# Patient Record
Sex: Female | Born: 1955 | Race: White | Hispanic: No | Marital: Married | Smoking: Never smoker
Health system: Southern US, Community
[De-identification: ages and names within clinical notes are randomized; demographics above are authoritative.]

## PROBLEM LIST (undated history)

## (undated) DIAGNOSIS — A770 Spotted fever due to Rickettsia rickettsii: Secondary | ICD-10-CM

## (undated) DIAGNOSIS — K5792 Diverticulitis of intestine, part unspecified, without perforation or abscess without bleeding: Secondary | ICD-10-CM

## (undated) DIAGNOSIS — K2970 Gastritis, unspecified, without bleeding: Secondary | ICD-10-CM

## (undated) HISTORY — PX: APPENDECTOMY: SHX54

---

## 1999-01-23 ENCOUNTER — Other Ambulatory Visit: Admission: RE | Admit: 1999-01-23 | Discharge: 1999-01-23 | Payer: Self-pay | Admitting: Obstetrics and Gynecology

## 2000-04-19 ENCOUNTER — Inpatient Hospital Stay (HOSPITAL_COMMUNITY): Admission: AD | Admit: 2000-04-19 | Discharge: 2000-04-19 | Payer: Self-pay | Admitting: Urology

## 2002-08-06 ENCOUNTER — Encounter: Payer: Self-pay | Admitting: Neurosurgery

## 2004-08-02 ENCOUNTER — Encounter (INDEPENDENT_AMBULATORY_CARE_PROVIDER_SITE_OTHER): Payer: Self-pay | Admitting: Specialist

## 2004-08-02 ENCOUNTER — Ambulatory Visit (HOSPITAL_COMMUNITY): Admission: RE | Admit: 2004-08-02 | Discharge: 2004-08-02 | Payer: Self-pay | Admitting: Plastic Surgery

## 2004-08-02 ENCOUNTER — Ambulatory Visit (HOSPITAL_BASED_OUTPATIENT_CLINIC_OR_DEPARTMENT_OTHER): Admission: RE | Admit: 2004-08-02 | Discharge: 2004-08-02 | Payer: Self-pay | Admitting: Plastic Surgery

## 2006-12-27 ENCOUNTER — Emergency Department (HOSPITAL_COMMUNITY): Admission: EM | Admit: 2006-12-27 | Discharge: 2006-12-27 | Payer: Self-pay | Admitting: Emergency Medicine

## 2006-12-31 ENCOUNTER — Emergency Department (HOSPITAL_COMMUNITY): Admission: EM | Admit: 2006-12-31 | Discharge: 2006-12-31 | Payer: Self-pay | Admitting: Emergency Medicine

## 2007-03-19 ENCOUNTER — Emergency Department (HOSPITAL_COMMUNITY): Admission: EM | Admit: 2007-03-19 | Discharge: 2007-03-20 | Payer: Self-pay | Admitting: Emergency Medicine

## 2007-03-20 ENCOUNTER — Inpatient Hospital Stay (HOSPITAL_COMMUNITY): Admission: AD | Admit: 2007-03-20 | Discharge: 2007-03-20 | Payer: Self-pay | Admitting: Obstetrics and Gynecology

## 2007-03-20 ENCOUNTER — Emergency Department (HOSPITAL_COMMUNITY): Admission: EM | Admit: 2007-03-20 | Discharge: 2007-03-21 | Payer: Self-pay | Admitting: Emergency Medicine

## 2007-03-21 ENCOUNTER — Emergency Department (HOSPITAL_COMMUNITY): Admission: EM | Admit: 2007-03-21 | Discharge: 2007-03-21 | Payer: Self-pay | Admitting: Emergency Medicine

## 2007-03-24 ENCOUNTER — Encounter: Admission: RE | Admit: 2007-03-24 | Discharge: 2007-03-24 | Payer: Self-pay | Admitting: Otolaryngology

## 2007-03-30 ENCOUNTER — Emergency Department (HOSPITAL_COMMUNITY): Admission: EM | Admit: 2007-03-30 | Discharge: 2007-03-31 | Payer: Self-pay | Admitting: Emergency Medicine

## 2007-07-23 ENCOUNTER — Emergency Department (HOSPITAL_COMMUNITY): Admission: EM | Admit: 2007-07-23 | Discharge: 2007-07-23 | Payer: Self-pay | Admitting: Family Medicine

## 2008-01-13 ENCOUNTER — Ambulatory Visit (HOSPITAL_COMMUNITY): Admission: RE | Admit: 2008-01-13 | Discharge: 2008-01-13 | Payer: Self-pay | Admitting: Obstetrics and Gynecology

## 2008-01-13 ENCOUNTER — Encounter (INDEPENDENT_AMBULATORY_CARE_PROVIDER_SITE_OTHER): Payer: Self-pay | Admitting: Obstetrics and Gynecology

## 2008-08-11 ENCOUNTER — Encounter: Admission: RE | Admit: 2008-08-11 | Discharge: 2008-08-11 | Payer: Self-pay | Admitting: Otolaryngology

## 2010-07-23 ENCOUNTER — Encounter: Payer: Self-pay | Admitting: Obstetrics and Gynecology

## 2010-07-23 ENCOUNTER — Encounter: Payer: Self-pay | Admitting: Gastroenterology

## 2010-11-13 NOTE — Op Note (Signed)
NAMEJUBILEE, VIVERO               ACCOUNT NO.:  [id number]   MEDICAL RECORD NO.:  [id number]          PATIENT TYPE:  AMB   LOCATION:  SDC                           FACILITY:  [facility]   PHYSICIAN:  Juluis Mire, M.D.   DATE OF BIRTH:  29-Mar-1956   DATE OF PROCEDURE:  01/13/2008  DATE OF DISCHARGE:                               OPERATIVE REPORT   PREOPERATIVE DIAGNOSES:  Endometrial polyp and uterine fibroids.   POSTOPERATIVE DIAGNOSES:  Endometrial polyp and uterine fibroids.   PROCEDURES:  1. Paracervical block.  2. Cervical dilatation.  3. Hysteroscopic evaluation with resection of polyp.  4. Biopsy of uterine fibroids.  5. Endometrial curetting.   SURGEON:  Juluis Mire, MD.   ANESTHESIA:  General.   ESTIMATED BLOOD LOSS:  Minimal.   PACKS AND DRAINS:  None.   INTRAOPERATIVE BLOOD PLACED:  None.   COMPLICATIONS:  None.   INDICATIONS:  As dictated in the history and physical.   PROCEDURE:  The patient was taken to the OR and placed in supine  position.  After satisfactory level of general anesthesia was obtained,  the patient was placed in dorsal spine position using the Allen  stirrups.  The perineum and vagina were prepped out with Betadine and  bladder was emptied by catheterization.  The patient was then draped in  sterile field.  A speculum was placed in the vaginal vault.  The cervix  was grasped with a single-tooth tenaculum.  Uterus was sounded to  approximately 10 cm.  Cervix was serially dilated to a size 29 Pratt  dilator.  Operative hysteroscope was then introduced.  It was noted that  the uterine cavity did deviate slightly to the patient's left side.  She  did have a polyp-like growth to the endocervical canal.  This was  resected and sent to pathology.  As we proceeded up to the uterine  fundus, she had a large submucosal fibroid posteriorly and apparent  polyp above this.  The polyp was resected totally and sent for pathology  and biopsy was attained  of the uterine fibroid.  The rest of the  endometrium was atrophic and unremarkable.  Total deficit was 100 mL.  We did do endometrial curettings.  At this  point in time, the hysteroscope and single-tooth speculum were then  removed.  The patient was taken out of the dorsal position once alert  and extubated and transferred to the recovery room in good condition.  Sponge, instrument, and needle count was reported correct by circulating  nursing.      Juluis Mire, M.D.  Electronically Signed     JSM/MEDQ  D:  01/13/2008  T:  01/14/2008  Job:  [id number]

## 2010-11-13 NOTE — H&P (Signed)
Jennifer Cline, Jennifer Cline               ACCOUNT NO.:  [id number]   MEDICAL RECORD NO.:  [id number]           PATIENT TYPE:   LOCATION:                                 FACILITY:   PHYSICIAN:  Juluis Mire, M.D.   DATE OF BIRTH:  08/23/1955   DATE OF ADMISSION:  DATE OF DISCHARGE:                              HISTORY & PHYSICAL   The patient is a 55 year old, gravida 2, para 2, perimenopausal patient  presents for hysteroscopy.   In terms of the present admission, the patient had a previous  endometrial sampling in 2008, it revealed simple hyperplasia.  There was  no atypia.  Recommendation was for a followup ultrasound and endometrial  sampling.  She was placed on hormone replacement therapy in terms of the  estradiol and Aygestin.  She was not having any vaginal bleeding.  We  repeated a saline infusion ultrasound.  She had evidence of a  significant endometrial polyp and now presents for hysteroscopic  resection.   In terms of allergies, she has no known drug allergies.   MEDICATIONS:  Estradiol 0.5 mg daily and Aygestin 5 mg day 1-20 of each  month.   PAST MEDICAL HISTORY:  Usual childhood diseases.  No significant  sequelae.   PAST SURGICAL HISTORY:  She had appendectomy.  She had a bilateral tubal  ligation.  She had a Bartholin cyst removed.   OBSTETRICAL HISTORY:  She has had 2 vaginal deliveries.   FAMILY HISTORY:  Noncontributory.   SOCIAL HISTORY:  Reveals no tobacco or alcohol use.   REVIEW OF SYSTEMS:  Noncontributory.   PHYSICAL EXAMINATION:  GENERAL:  The patient is afebrile.  VITAL SIGNS:  Stable.  HEENT:  The patient is normocephalic.  Pupils are equal, round, and  reactive to light and accommodation.  Extraocular movements are intact.  Sclerae and conjunctivae are clear.  Oropharynx clear.  NECK:  Without thyromegaly.  BREASTS:  No discrete masses.  LUNGS:  Clear.  CARDIAC SYSTEMS:  Regular rhythm and rate without murmurs or gallops.  ABDOMINAL:   Benign.  No masses, organomegaly, or tenderness.  PELVIC:  Normal external genitalia.  Vaginal mucosa is clear.  Cervix  unremarkable.  Uterus normal size, shape, and contour.  Adnexa free of  masses or tenderness.  EXTREMITIES:  Trace edema.  NEURO:  Grossly within normal limits.   IMPRESSION:  1. Endometrial polyp.  2. History of simple hyperplasia.  3. Exogenous hormone replacement therapy.   PLAN:  At the present time, the patient is to undergo hysteroscopic  evaluation and resection of polyp.  Risks of surgery have been discussed  including the risk of infection.  The risk of hemorrhage, could require  transfusion with the risk of AIDS or hepatitis.  Risk of injury to  adjacent organs through perforation that could require exploratory  surgery.  Risk of deep venous thrombosis and pulmonary embolus.  The  patient verbalizes to understand the indications and risks.      Juluis Mire, M.D.  Electronically Signed     JSM/MEDQ  D:  01/13/2008  T:  01/13/2008  Job:  [id number]

## 2010-11-16 NOTE — Op Note (Signed)
[facility]  Patient:    Jennifer Cline, Jennifer Cline                      MRN: [id number] Proc. Date: 04/19/00 Adm. Date:  [id number] Attending:  Marina Gravel B                           Operative Report  HISTORY OF PRESENT ILLNESS:     Ms. 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  She presents with a two to three day history of increasing right labial swelling and pain.  She was seen in the office on Friday and was noted to have a small Bartholins cyst.  At that point, it was not deemed drainable.  She was given Augmentin to take b.i.d. and instructed to notify us should her pain worsen.  Over the past 24 hours, the pain has increased steadily and the swelling has increased as well.  She called the answering service today to tell me that her symptoms had progressed.  She was instructed to report to maternity admissions for I&D and catheter placement.  PAST MEDICAL HISTORY:           None.  PAST SURGICAL HISTORY:          Marsupialization of Bartholins cysts in the past.  MEDICATIONS:                    Augmentin.  ALLERGIES:                      None.  PHYSICAL EXAMINATION:  VITAL SIGNS:                    Temperature 98.4, pulse 109, blood pressure 130/85.  GENERAL:                        The patient is alert and oriented.  She appears to be in moderate discomfort.  SKIN:                           Warm and dry with no lesions.  PELVIC:                         Normal external female genitalia.  However, there is marked swelling of the right labia majora consistent with a right Bartholins gland cyst.  It is acutely inflamed and tender.  Speculum exam: The remainder of the vagina and cervix appeared normal.  PROCEDURE:                      The skin over the right labia majora was prepped with Betadine and infiltrated with approximately 5 cc of 1%  lidocaine. Incision was made with a knife in the vaginal mucosa.  The abscess was drained with a hemostat.  Word catheter placed.  The patient tolerated well and noted immediate relief of symptoms.  ASSESSMENT:                     Bartholins gland cyst abscess, status post irrigation and drainage and placement of Word catheter.  PLAN:  Continue Augmentin as before.  Follow up in the office on Monday to assess progress.  The patient is instructed to notify us with worsening pain, fever, or other associated symptoms. DD:  04/19/01 TD:  04/20/00 Job: [id number]

## 2011-03-21 LAB — POCT URINALYSIS DIP (DEVICE)
Glucose, UA: NEGATIVE
Ketones, ur: >=160 — AB
Operator id: 126491
Specific Gravity, Urine: 1.01

## 2011-03-28 LAB — CBC
HCT: 43.7
Hemoglobin: 14.9
MCHC: 34
MCV: 87.3
RBC: 5
WBC: 4.8

## 2011-04-11 LAB — URINALYSIS, ROUTINE W REFLEX MICROSCOPIC
Bilirubin Urine: NEGATIVE
Glucose, UA: NEGATIVE
Glucose, UA: NEGATIVE
Hgb urine dipstick: NEGATIVE
Ketones, ur: 40 — AB
Specific Gravity, Urine: >1.03 — ABNORMAL HIGH
Urobilinogen, UA: 0.2
pH: 5
pH: 6

## 2011-04-11 LAB — HEPATIC FUNCTION PANEL
ALT: <8
AST: 11
Total Protein: 7

## 2011-04-11 LAB — CBC
MCV: 86.6
Platelets: 364
WBC: 8.6

## 2011-04-11 LAB — DIFFERENTIAL
Eosinophils Relative: 0
Lymphocytes Relative: 20
Lymphs Abs: 1.7
Monocytes Absolute: 0.6

## 2011-04-11 LAB — COMPREHENSIVE METABOLIC PANEL
AST: 13
Albumin: 4.8
Calcium: 9.6
Chloride: 108
Creatinine, Ser: 0.72
GFR calc Af Amer: >60
Sodium: 142
Total Bilirubin: 2.8 — ABNORMAL HIGH

## 2011-04-11 LAB — URINE MICROSCOPIC-ADD ON

## 2011-04-11 LAB — SEDIMENTATION RATE: Sed Rate: 1

## 2011-04-11 LAB — URINE CULTURE: Colony Count: >=100000

## 2011-04-16 LAB — URINE MICROSCOPIC-ADD ON

## 2011-04-16 LAB — URINALYSIS, ROUTINE W REFLEX MICROSCOPIC
Bilirubin Urine: NEGATIVE
Nitrite: NEGATIVE
Specific Gravity, Urine: 1.012
Urobilinogen, UA: 0.2

## 2011-04-16 LAB — CBC
MCHC: 34.8
MCV: 83.8
RBC: 5.17 — ABNORMAL HIGH

## 2011-04-16 LAB — DIFFERENTIAL
Basophils Absolute: 0
Lymphocytes Relative: 17
Lymphs Abs: 1.5
Monocytes Absolute: 0.2
Neutro Abs: 7.3

## 2011-04-17 LAB — DIFFERENTIAL
Basophils Absolute: 0
Basophils Relative: 0
Neutro Abs: 9.1 — ABNORMAL HIGH
Neutrophils Relative %: 85 — ABNORMAL HIGH

## 2011-04-17 LAB — COMPREHENSIVE METABOLIC PANEL
ALT: 34
AST: 26
Albumin: 4
Alkaline Phosphatase: 68
BUN: 12
CO2: 27
Calcium: 9.4
Chloride: 104
Creatinine, Ser: 0.77
GFR calc Af Amer: >60
GFR calc non Af Amer: >60
Glucose, Bld: 126 — ABNORMAL HIGH
Potassium: 4
Sodium: 139
Total Bilirubin: 2.6 — ABNORMAL HIGH
Total Protein: 7

## 2011-04-17 LAB — CBC
HCT: 41.5
Hemoglobin: 14.4
MCV: 82.9
WBC: 10.7 — ABNORMAL HIGH

## 2011-04-17 LAB — PROTIME-INR
INR: 1
Prothrombin Time: 12.9

## 2011-04-17 LAB — APTT: aPTT: 27

## 2011-09-04 DIAGNOSIS — J329 Chronic sinusitis, unspecified: Secondary | ICD-10-CM | POA: Insufficient documentation

## 2011-11-01 DIAGNOSIS — J302 Other seasonal allergic rhinitis: Secondary | ICD-10-CM | POA: Insufficient documentation

## 2012-11-12 DIAGNOSIS — J329 Chronic sinusitis, unspecified: Secondary | ICD-10-CM | POA: Insufficient documentation

## 2012-12-09 DIAGNOSIS — K5792 Diverticulitis of intestine, part unspecified, without perforation or abscess without bleeding: Secondary | ICD-10-CM | POA: Insufficient documentation

## 2014-02-14 DIAGNOSIS — F4001 Agoraphobia with panic disorder: Secondary | ICD-10-CM | POA: Insufficient documentation

## 2014-03-12 ENCOUNTER — Observation Stay (HOSPITAL_COMMUNITY)
Admission: AD | Admit: 2014-03-12 | Discharge: 2014-03-14 | Disposition: A | Discharge status: Home / Self Care | Payer: BC Managed Care – PPO | Source: Intra-hospital | Attending: Psychiatry | Admitting: Psychiatry

## 2014-03-12 ENCOUNTER — Emergency Department (HOSPITAL_COMMUNITY)
Admission: EM | Admit: 2014-03-12 | Discharge: 2014-03-12 | Disposition: A | Discharge status: Other Acute Inpatient Hospital | Payer: BC Managed Care – PPO | Attending: Emergency Medicine | Admitting: Emergency Medicine

## 2014-03-12 ENCOUNTER — Encounter (HOSPITAL_COMMUNITY): Payer: Self-pay | Admitting: Emergency Medicine

## 2014-03-12 DIAGNOSIS — F1310 Sedative, hypnotic or anxiolytic abuse, uncomplicated: Secondary | ICD-10-CM | POA: Diagnosis not present

## 2014-03-12 DIAGNOSIS — G4700 Insomnia, unspecified: Secondary | ICD-10-CM | POA: Insufficient documentation

## 2014-03-12 DIAGNOSIS — Z8619 Personal history of other infectious and parasitic diseases: Secondary | ICD-10-CM | POA: Diagnosis not present

## 2014-03-12 DIAGNOSIS — R45851 Suicidal ideations: Secondary | ICD-10-CM | POA: Diagnosis not present

## 2014-03-12 DIAGNOSIS — F419 Anxiety disorder, unspecified: Secondary | ICD-10-CM

## 2014-03-12 DIAGNOSIS — F13230 Sedative, hypnotic or anxiolytic dependence with withdrawal, uncomplicated: Secondary | ICD-10-CM

## 2014-03-12 DIAGNOSIS — Z8719 Personal history of other diseases of the digestive system: Secondary | ICD-10-CM | POA: Diagnosis not present

## 2014-03-12 DIAGNOSIS — F13239 Sedative, hypnotic or anxiolytic dependence with withdrawal, unspecified: Secondary | ICD-10-CM | POA: Diagnosis present

## 2014-03-12 DIAGNOSIS — F1320 Sedative, hypnotic or anxiolytic dependence, uncomplicated: Principal | ICD-10-CM | POA: Insufficient documentation

## 2014-03-12 DIAGNOSIS — F411 Generalized anxiety disorder: Secondary | ICD-10-CM | POA: Insufficient documentation

## 2014-03-12 DIAGNOSIS — F329 Major depressive disorder, single episode, unspecified: Secondary | ICD-10-CM | POA: Diagnosis not present

## 2014-03-12 DIAGNOSIS — F13930 Sedative, hypnotic or anxiolytic use, unspecified with withdrawal, uncomplicated: Secondary | ICD-10-CM

## 2014-03-12 DIAGNOSIS — F13939 Sedative, hypnotic or anxiolytic use, unspecified with withdrawal, unspecified: Secondary | ICD-10-CM | POA: Diagnosis present

## 2014-03-12 DIAGNOSIS — Z008 Encounter for other general examination: Secondary | ICD-10-CM | POA: Diagnosis present

## 2014-03-12 DIAGNOSIS — F3289 Other specified depressive episodes: Secondary | ICD-10-CM

## 2014-03-12 DIAGNOSIS — F13232 Sedative, hypnotic or anxiolytic dependence with withdrawal with perceptual disturbance: Secondary | ICD-10-CM

## 2014-03-12 DIAGNOSIS — F13932 Sedative, hypnotic or anxiolytic use, unspecified with withdrawal with perceptual disturbances: Secondary | ICD-10-CM

## 2014-03-12 DIAGNOSIS — F32A Depression, unspecified: Secondary | ICD-10-CM

## 2014-03-12 HISTORY — DX: Diverticulitis of intestine, part unspecified, without perforation or abscess without bleeding: K57.92

## 2014-03-12 HISTORY — DX: Spotted fever due to Rickettsia rickettsii: A77.0

## 2014-03-12 HISTORY — DX: Gastritis, unspecified, without bleeding: K29.70

## 2014-03-12 LAB — COMPREHENSIVE METABOLIC PANEL
ALK PHOS: 62 U/L (ref 39–117)
ALT: 25 U/L (ref 0–35)
ANION GAP: 14 (ref 5–15)
AST: 21 U/L (ref 0–37)
Albumin: 4.5 g/dL (ref 3.5–5.2)
BUN: 4 mg/dL — AB (ref 6–23)
CO2: 24 meq/L (ref 19–32)
Calcium: 9.7 mg/dL (ref 8.4–10.5)
Chloride: 103 mEq/L (ref 96–112)
Creatinine, Ser: 0.62 mg/dL (ref 0.50–1.10)
GLUCOSE: 101 mg/dL — AB (ref 70–99)
POTASSIUM: 4 meq/L (ref 3.7–5.3)
SODIUM: 141 meq/L (ref 137–147)
TOTAL PROTEIN: 7 g/dL (ref 6.0–8.3)
Total Bilirubin: 3.3 mg/dL — ABNORMAL HIGH (ref 0.3–1.2)

## 2014-03-12 LAB — CBC WITH DIFFERENTIAL/PLATELET
Basophils Absolute: 0 10*3/uL (ref 0.0–0.1)
Basophils Relative: 1 % (ref 0–1)
EOS ABS: 0 10*3/uL (ref 0.0–0.7)
EOS PCT: 1 % (ref 0–5)
HCT: 42.2 % (ref 36.0–46.0)
HEMOGLOBIN: 14.5 g/dL (ref 12.0–15.0)
LYMPHS ABS: 1.3 10*3/uL (ref 0.7–4.0)
LYMPHS PCT: 30 % (ref 12–46)
MCH: 28.8 pg (ref 26.0–34.0)
MCHC: 34.4 g/dL (ref 30.0–36.0)
MCV: 83.7 fL (ref 78.0–100.0)
MONOS PCT: 9 % (ref 3–12)
Monocytes Absolute: 0.4 10*3/uL (ref 0.1–1.0)
NEUTROS PCT: 59 % (ref 43–77)
Neutro Abs: 2.5 10*3/uL (ref 1.7–7.7)
PLATELETS: 232 10*3/uL (ref 150–400)
RBC: 5.04 MIL/uL (ref 3.87–5.11)
RDW: 13.4 % (ref 11.5–15.5)
WBC: 4.3 10*3/uL (ref 4.0–10.5)

## 2014-03-12 LAB — RAPID URINE DRUG SCREEN, HOSP PERFORMED
Amphetamines: NOT DETECTED
BARBITURATES: NOT DETECTED
Benzodiazepines: POSITIVE — AB
Cocaine: NOT DETECTED
Opiates: NOT DETECTED
Tetrahydrocannabinol: NOT DETECTED

## 2014-03-12 LAB — ETHANOL

## 2014-03-12 MED ORDER — ONDANSETRON 4 MG PO TBDP
4.0000 mg | ORAL_TABLET | Freq: Four times a day (QID) | ORAL | Status: DC | PRN
  Administered 2014-03-12: 4 mg via ORAL
  Filled 2014-03-12: qty 1

## 2014-03-12 MED ORDER — LOPERAMIDE HCL 2 MG PO CAPS: 2.0000 mg | ORAL_CAPSULE | ORAL | Status: DC | PRN

## 2014-03-12 MED ORDER — VITAMIN B-1 100 MG PO TABS: 100.0000 mg | ORAL_TABLET | Freq: Every day | ORAL | Status: DC

## 2014-03-12 MED ORDER — PSEUDOEPHEDRINE HCL 30 MG PO TABS
30.0000 mg | ORAL_TABLET | Freq: Three times a day (TID) | ORAL | Status: DC | PRN
  Administered 2014-03-12: 30 mg via ORAL
  Filled 2014-03-12: qty 1

## 2014-03-12 MED ORDER — CHLORDIAZEPOXIDE HCL 25 MG PO CAPS: 25.0000 mg | ORAL_CAPSULE | Freq: Three times a day (TID) | ORAL | Status: DC

## 2014-03-12 MED ORDER — CHLORDIAZEPOXIDE HCL 25 MG PO CAPS: 25.0000 mg | ORAL_CAPSULE | ORAL | Status: DC

## 2014-03-12 MED ORDER — ALUM & MAG HYDROXIDE-SIMETH 200-200-20 MG/5ML PO SUSP: 30.0000 mL | ORAL | Status: DC | PRN

## 2014-03-12 MED ORDER — ACETAMINOPHEN 325 MG PO TABS: 650.0000 mg | ORAL_TABLET | ORAL | Status: DC | PRN

## 2014-03-12 MED ORDER — ADULT MULTIVITAMIN W/MINERALS CH
1.0000 | ORAL_TABLET | Freq: Every day | ORAL | Status: DC
  Administered 2014-03-12: 1 via ORAL
  Filled 2014-03-12: qty 1

## 2014-03-12 MED ORDER — CHLORDIAZEPOXIDE HCL 25 MG PO CAPS
25.0000 mg | ORAL_CAPSULE | Freq: Four times a day (QID) | ORAL | Status: DC
  Administered 2014-03-12 (×3): 25 mg via ORAL
  Filled 2014-03-12 (×3): qty 1

## 2014-03-12 MED ORDER — ONDANSETRON HCL 4 MG PO TABS: 4.0000 mg | ORAL_TABLET | Freq: Three times a day (TID) | ORAL | Status: DC | PRN

## 2014-03-12 MED ORDER — IBUPROFEN 200 MG PO TABS: 600.0000 mg | ORAL_TABLET | Freq: Three times a day (TID) | ORAL | Status: DC | PRN

## 2014-03-12 MED ORDER — MIRTAZAPINE 7.5 MG PO TABS
7.5000 mg | ORAL_TABLET | Freq: Every day | ORAL | Status: DC
  Administered 2014-03-12: 7.5 mg via ORAL
  Filled 2014-03-12: qty 1

## 2014-03-12 MED ORDER — CHLORDIAZEPOXIDE HCL 25 MG PO CAPS: 25.0000 mg | ORAL_CAPSULE | Freq: Four times a day (QID) | ORAL | Status: DC | PRN

## 2014-03-12 MED ORDER — CHLORDIAZEPOXIDE HCL 25 MG PO CAPS: 25.0000 mg | ORAL_CAPSULE | Freq: Every day | ORAL | Status: DC

## 2014-03-12 MED ORDER — HYDROXYZINE HCL 25 MG PO TABS: 25.0000 mg | ORAL_TABLET | Freq: Four times a day (QID) | ORAL | Status: DC | PRN

## 2014-03-12 MED ORDER — THIAMINE HCL 100 MG/ML IJ SOLN
100.0000 mg | Freq: Once | INTRAMUSCULAR | Status: AC
  Administered 2014-03-12: 100 mg via INTRAMUSCULAR
  Filled 2014-03-12: qty 2

## 2014-03-12 NOTE — ED Notes (Signed)
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  She subsequently developed an intestinal infection, the treatment of which was a bit protracted.  She had some anxiety associated "with all those problems" and was prescribed Xanax.  She is here today with request to have help to cease Xanax--"I'm just miserable and I don't want to go on like this."  Her husband is with her.

## 2014-03-12 NOTE — Progress Notes (Signed)
Pt's referral has been faxed to the following intpx facilities with bed availability: [facility]- per Misty Stanley a few adult beds available [facility]- faxed for review [facility]- per [place] accepting referrals [facility]- per Carney [facility] beds available [facility]- per [facility] can fax [facility]- per [place] having d/c's and can fax for review  The following facilities are at capacity or do not have SA programming: [facility]- SA exclusionary [facility]- per [place] at capacity [facility]- per Baird Lyons at [employer]- per [employer] at capacity [facility]- no detox [place]- per Rosey Bath at [facility]- per [place] at capacity [facility]- per [place] at [facility]- per [place] at capacity and "have an extensive wait list" [facility]- per [place] [facility]- per [place] at capacity [facility]- per Dannielle Huh at [employer] [facility]- no longer accept referrals per [facility]- [facility]- per Dahlia Client at capacity [facility]- per [place] at capacity   [employer] Disposition MHT

## 2014-03-12 NOTE — Progress Notes (Signed)
Patient admitted to the unit from [facility] with a C/O anxiety. Patient stated "I have a [place] spotted fever few months ago and was prescribed doxycycline and shortly I started having anxiety issues. I was prescribed xanax, the anxiety became worst and I became depressed." patient also stated that she wants to be detox off benzo. Patient denied SI, AH/VH. Patient reports being sleepy from the medication she took at the ED; requested to lay down. Refused nourishment and drink. Support and encouragement offered to the patient. Every 15 minutes check for safety initiated. Will continue to monitor patient.

## 2014-03-12 NOTE — Progress Notes (Signed)
Pt has been declined by [facility] per Tacey Ruiz d/t primary problem being SA.   Tomi Bamberger Disposition MHT

## 2014-03-12 NOTE — ED Notes (Signed)
Pt resting at present, no distress, will monitor for safety.

## 2014-03-12 NOTE — ED Provider Notes (Signed)
CSN: [id number]     Arrival date & time 03/12/14  0813 History   First MD Initiated Contact with Patient 03/12/14 [phone]     Chief Complaint  Patient presents with  . Withdrawal     (Consider location/radiation/quality/duration/timing/severity/associated sxs/prior Treatment) Patient is a 58 y.o. female presenting with mental health disorder. The history is provided by the patient. No language interpreter was used.  Mental Health Problem Presenting symptoms: depression and suicidal thoughts   Presenting symptoms: no agitation   Presenting symptoms comment:  Anxiety  Degree of incapacity (severity):  Severe Onset quality:  Gradual Duration:  2 months Timing:  Constant Progression:  Worsening Chronicity:  New Context: stressful life event   Relieved by:  Nothing Worsened by:  Nothing tried Ineffective treatments:  Anti-anxiety medications Associated symptoms: anhedonia, anxiety, appetite change, insomnia and weight change   Associated symptoms: no abdominal pain, no chest pain, no fatigue and no headaches   Risk factors: no family hx of mental illness, no family violence, no hx of mental illness, no hx of suicide attempts, no neurological disease and no recent psychiatric admission     Past Medical History  Diagnosis Date  . [facility] spotted fever   . Gastritis   . Diverticulitis    Past Surgical History  Procedure Laterality Date  . Appendectomy     History reviewed. No pertinent family history. History  Substance Use Topics  . Smoking status: Never Smoker   . Smokeless tobacco: Not on file  . Alcohol Use: No   OB History   Grav Para Term Preterm Abortions TAB SAB Ect Mult Living                 Review of Systems  Constitutional: Positive for appetite change. Negative for fever, chills, diaphoresis, activity change and fatigue.  HENT: Negative for congestion, facial swelling, rhinorrhea and sore throat.   Eyes: Negative for photophobia and discharge.   Respiratory: Negative for cough, chest tightness and shortness of breath.   Cardiovascular: Negative for chest pain, palpitations and leg swelling.  Gastrointestinal: Negative for nausea, vomiting, abdominal pain and diarrhea.  Endocrine: Negative for polydipsia and polyuria.  Genitourinary: Negative for dysuria, frequency, difficulty urinating and pelvic pain.  Musculoskeletal: Negative for arthralgias, back pain, neck pain and neck stiffness.  Skin: Negative for color change and wound.  Allergic/Immunologic: Negative for immunocompromised state.  Neurological: Negative for facial asymmetry, weakness, numbness and headaches.  Hematological: Does not bruise/bleed easily.  Psychiatric/Behavioral: Positive for suicidal ideas. Negative for confusion and agitation. The patient is nervous/anxious and has insomnia.       Allergies  Phenergan and Doxycycline  Home Medications   Prior to Admission medications   Medication Sig Start Date End Date Taking? Authorizing Provider  ALPRAZolam Prudy Feeler) 0.5 MG tablet Take 0.5 mg by mouth 3 (three) times daily.  02/28/14  Yes Historical Provider, MD  mirtazapine (REMERON) 15 MG tablet Take 15 mg by mouth at bedtime.  03/10/14   Historical Provider, MD   BP 127/65  Pulse 79  Temp(Src) 98.1 F (36.7 C) (Oral)  Resp 17  SpO2 96% Physical Exam  Constitutional: She is oriented to person, place, and time. She appears well-developed and well-nourished. No distress.  HENT:  Head: Normocephalic and atraumatic.  Mouth/Throat: No oropharyngeal exudate.  Eyes: Pupils are equal, round, and reactive to light.  Neck: Normal range of motion. Neck supple.  Cardiovascular: Normal rate, regular rhythm and normal heart sounds.  Exam reveals no gallop and  no friction rub.   No murmur heard. Pulmonary/Chest: Effort normal and breath sounds normal. No respiratory distress. She has no wheezes. She has no rales.  Abdominal: Soft. Bowel sounds are normal. She exhibits  no distension and no mass. There is no tenderness. There is no rebound and no guarding.  Musculoskeletal: Normal range of motion. She exhibits no edema and no tenderness.  Neurological: She is alert and oriented to person, place, and time.  Skin: Skin is warm and dry.  Psychiatric: Her mood appears anxious. She exhibits a depressed mood. She expresses suicidal ideation.  tearful    ED Course  Procedures (including critical care time) Labs Review Labs Reviewed  COMPREHENSIVE METABOLIC PANEL - Abnormal; Notable for the following:    Glucose, Bld 101 (*)    BUN 4 (*)    Total Bilirubin 3.3 (*)    All other components within normal limits  URINE RAPID DRUG SCREEN (HOSP PERFORMED) - Abnormal; Notable for the following:    Benzodiazepines POSITIVE (*)    All other components within normal limits  CBC WITH DIFFERENTIAL  ETHANOL    Imaging Review No results found.   EKG Interpretation None      MDM   Final diagnoses:  Anxiety  Depression  Suicidal ideation  Benzodiazepine dependence    Pt is a 58 y.o. female with Pmhx as above who presents with request for detox from Xanax which she has been taking for the past 2-3 months for increased anxiety. She states for about 2 months she has been having worsening depression and suicidal ideation without clear plan. She states that the episode began with several illnesses including [facility] spotted fever followed by diverticulitis, followed by gastritis. She was placed on amoxicillin for a sinusitis and her last dose of antibiotic was to be today. She denies any other drug use, or alcohol use. She's had no recent fever, chills, coughing, nausea vomiting, diarrhea. She denies HI, or AVH.  On exam she appears anxious, but in no acute distress.  I feel she would benefit from psychiatry consult for evaluation for her depression, suicidal ideation, and request for detox from Xanax.    Pt will be admitted as inpt for BZD detox.      Toy Cookey, MD 03/12/14 [phone]

## 2014-03-12 NOTE — Progress Notes (Signed)
[facility] MD Progress Note  03/12/2014 8:44 PM Jennifer Cline  MRN:  [id number] Subjective:  Request from Nurse to see pt .Requesting to leave after being transferred to obs unit earlier prior to my comming on.Pt is voluntary Diagnosis: Benxzodiazepene/xanax dependence  DSM5: Schizophrenia Disorders:  na Obsessive-Compulsive Disorders:  na Trauma-Stressor Disorders:  na Substance/Addictive Disorders:  Benzodiazepene dependence Depressive Disorders:  NA Total Time spent with patient: 15 minutes  Axis I: Benzodiazepene dependence (iatrogenic) Axis II: Deferred Axis III:  Past Medical History  Diagnosis Date  . [facility] spotted fever   . Gastritis   . Diverticulitis    Axis IV: other psychosocial or environmental problems Axis V: 41-50 serious symptoms  ADL's:  Intact  Sleep: Poor  Appetite:  Fair  Suicidal Ideation:  Intent:  none Homicidal Ideation:  Intent:  none AEB (as evidenced by):  Psychiatric Specialty Exam: Physical Exam  Nursing note and vitals reviewed. Constitutional: She appears well-developed and well-nourished. She appears distressed (anxious-c/o of withdrawal symptoms.Told nurse she wanted to go home tather than transfer to OBS).  HENT:  Head: Normocephalic and atraumatic.  Right Ear: External ear normal.  Left Ear: External ear normal.  C/o of sinus/allergy requests sudafed  Eyes: Conjunctivae and EOM are normal. Pupils are equal, round, and reactive to light. Right eye exhibits no discharge. Left eye exhibits no discharge. No scleral icterus.  Neck: Normal range of motion. Neck supple. No JVD present. No tracheal deviation present.  Cardiovascular: Normal rate and regular rhythm.   Respiratory: Effort normal and breath sounds normal. No stridor.  GI:  Deferred  Genitourinary:  cancel  Musculoskeletal: Normal range of motion.  Neurological: She is alert. No cranial nerve deficit. She exhibits normal muscle tone. Coordination normal.  Skin: Skin  is warm and dry.  Psychiatric:  See PSE    ROS  Blood pressure 113/58, pulse 88, temperature 97.9 F (36.6 C), temperature source Oral, resp. rate 16, SpO2 99.00%.There is no height or weight on file to calculate BMI.  General Appearance: Fairly Groomed  Engineer, water::  Good  Speech:  Clear and Coherent  Volume:  Normal  Mood:  Dysphoric  Affect:  Congruent  Thought Process:  Goal Directed and Intact  Orientation:  Full (Time, Place, and Person)  Thought Content:  WDL  Suicidal Thoughts:  No  Homicidal Thoughts:  No  Memory:  Negative  Judgement:  Fair  Insight:  Fair  Psychomotor Activity:  Normal  Concentration:  Fair  Recall:  [employer] of Knowledge:Fair  Language: Fair  Akathisia:  NA  Handed:  Right  AIMS (if indicated):     Assets:  Desire for Improvement Financial Resources/Insurance Intimacy Social Support  Sleep:  Unable to get good night's sleep with xanax withdrawal   Musculoskeletal: Strength & Muscle Tone: within normal limits Gait & Station: normal Patient leans: N/A  Current Medications: Current Facility-Administered Medications  Medication Dose Route Frequency Provider Last Rate Last Dose  . acetaminophen (TYLENOL) tablet 650 mg  650 mg Oral Q4H PRN Ernestina Patches, MD      . alum & mag hydroxide-simeth (MAALOX/MYLANTA) 200-200-20 MG/5ML suspension 30 mL  30 mL Oral PRN Ernestina Patches, MD      . chlordiazePOXIDE (LIBRIUM) capsule 25 mg  25 mg Oral Q6H PRN Waylan Boga, NP      . chlordiazePOXIDE (LIBRIUM) capsule 25 mg  25 mg Oral QID Waylan Boga, NP   25 mg at 03/12/14 1817   Followed by  . [  START ON 03/13/2014] chlordiazePOXIDE (LIBRIUM) capsule 25 mg  25 mg Oral TID Waylan Boga, NP       Followed by  . [START ON 03/14/2014] chlordiazePOXIDE (LIBRIUM) capsule 25 mg  25 mg Oral BH-qamhs Waylan Boga, NP       Followed by  . [START ON 03/15/2014] chlordiazePOXIDE (LIBRIUM) capsule 25 mg  25 mg Oral Daily Waylan Boga, NP      . hydrOXYzine  (ATARAX/VISTARIL) tablet 25 mg  25 mg Oral Q6H PRN Waylan Boga, NP      . ibuprofen (ADVIL,MOTRIN) tablet 600 mg  600 mg Oral Q8H PRN Ernestina Patches, MD      . loperamide (IMODIUM) capsule 2-4 mg  2-4 mg Oral PRN Waylan Boga, NP      . mirtazapine (REMERON) tablet 7.5 mg  7.5 mg Oral QHS Waylan Boga, NP      . multivitamin with minerals tablet 1 tablet  1 tablet Oral Daily Waylan Boga, NP   1 tablet at 03/12/14 1450  . ondansetron (ZOFRAN) tablet 4 mg  4 mg Oral Q8H PRN Ernestina Patches, MD      . ondansetron (ZOFRAN-ODT) disintegrating tablet 4 mg  4 mg Oral Q6H PRN Waylan Boga, NP   4 mg at 03/12/14 1440  . pseudoephedrine (SUDAFED) tablet 30 mg  30 mg Oral Q8H PRN Dara Hoyer, PA-C      . [START ON 03/13/2014] thiamine (VITAMIN B-1) tablet 100 mg  100 mg Oral Daily Waylan Boga, NP       Current Outpatient Prescriptions  Medication Sig Dispense Refill  . ALPRAZolam (XANAX) 0.5 MG tablet Take 0.5 mg by mouth 3 (three) times daily.       . mirtazapine (REMERON) 15 MG tablet Take 15 mg by mouth at bedtime.         Lab Results:  Results for orders placed during the hospital encounter of 03/12/14 (from the past 48 hour(s))  CBC WITH DIFFERENTIAL     Status: None   Collection Time    03/12/14  8:57 AM      Result Value Ref Range   WBC 4.3  4.0 - 10.5 K/uL   RBC 5.04  3.87 - 5.11 MIL/uL   Hemoglobin 14.5  12.0 - 15.0 g/dL   HCT 42.2  36.0 - 46.0 %   MCV 83.7  78.0 - 100.0 fL   MCH 28.8  26.0 - 34.0 pg   MCHC 34.4  30.0 - 36.0 g/dL   RDW 13.4  11.5 - 15.5 %   Platelets 232  150 - 400 K/uL   Neutrophils Relative % 59  43 - 77 %   Neutro Abs 2.5  1.7 - 7.7 K/uL   Lymphocytes Relative 30  12 - 46 %   Lymphs Abs 1.3  0.7 - 4.0 K/uL   Monocytes Relative 9  3 - 12 %   Monocytes Absolute 0.4  0.1 - 1.0 K/uL   Eosinophils Relative 1  0 - 5 %   Eosinophils Absolute 0.0  0.0 - 0.7 K/uL   Basophils Relative 1  0 - 1 %   Basophils Absolute 0.0  0.0 - 0.1 K/uL  COMPREHENSIVE METABOLIC  PANEL     Status: Abnormal   Collection Time    03/12/14  8:57 AM      Result Value Ref Range   Sodium 141  137 - 147 mEq/L   Potassium 4.0  3.7 - 5.3 mEq/L   Chloride 103  96 -  112 mEq/L   CO2 24  19 - 32 mEq/L   Glucose, Bld 101 (*) 70 - 99 mg/dL   BUN 4 (*) 6 - 23 mg/dL   Creatinine, Ser 0.62  0.50 - 1.10 mg/dL   Calcium 9.7  8.4 - 10.5 mg/dL   Total Protein 7.0  6.0 - 8.3 g/dL   Albumin 4.5  3.5 - 5.2 g/dL   AST 21  0 - 37 U/L   ALT 25  0 - 35 U/L   Alkaline Phosphatase 62  39 - 117 U/L   Total Bilirubin 3.3 (*) 0.3 - 1.2 mg/dL   GFR calc non Af Amer >90  >90 mL/min   GFR calc Af Amer >90  >90 mL/min   Comment: (NOTE)     The eGFR has been calculated using the CKD EPI equation.     This calculation has not been validated in all clinical situations.     eGFR's persistently <90 mL/min signify possible Chronic Kidney     Disease.   Anion gap 14  5 - 15  ETHANOL     Status: None   Collection Time    03/12/14  8:57 AM      Result Value Ref Range   Alcohol, Ethyl (B) <11  0 - 11 mg/dL   Comment:            LOWEST DETECTABLE LIMIT FOR     SERUM ALCOHOL IS 11 mg/dL     FOR MEDICAL PURPOSES ONLY  URINE RAPID DRUG SCREEN (HOSP PERFORMED)     Status: Abnormal   Collection Time    03/12/14  9:00 AM      Result Value Ref Range   Opiates NONE DETECTED  NONE DETECTED   Cocaine NONE DETECTED  NONE DETECTED   Benzodiazepines POSITIVE (*) NONE DETECTED   Amphetamines NONE DETECTED  NONE DETECTED   Tetrahydrocannabinol NONE DETECTED  NONE DETECTED   Barbiturates NONE DETECTED  NONE DETECTED   Comment:            DRUG SCREEN FOR MEDICAL PURPOSES     ONLY.  IF CONFIRMATION IS NEEDED     FOR ANY PURPOSE, NOTIFY LAB     WITHIN 5 DAYS.                LOWEST DETECTABLE LIMITS     FOR URINE DRUG SCREEN     Drug Class       Cutoff (ng/mL)     Amphetamine      1000     Barbiturate      200     Benzodiazepine   330     Tricyclics       076     Opiates          300     Cocaine           300     THC              50    Physical Findings: AIMS:  , ,  ,  ,    CIWA:    COWS:     Treatment Plan Summary: Daily contact with patient to assess and evaluate symptoms and progress in treatment Medication management  Plan:Pt agrees to stay and transfer to observation unit and continue detox protocol .She also requests sudafed for sinus/allergies  Medical Decision Making Problem Points:  Established problem, worsening (2) Data Points:  Review or order clinical lab tests (1)  Review and summation of old records (2)  I certify that inpatient services furnished can reasonably be expected to improve the patient's condition.   Dara Hoyer 03/12/2014, 8:44 PM

## 2014-03-12 NOTE — ED Notes (Signed)
Pt now stating she wants to  [employer], go home.  Dr Linwood Dibbles notified.  Pending psych extender at 8pm to speak with pt.

## 2014-03-12 NOTE — ED Notes (Addendum)
Patient belongings: Shirt, pants, slides, sunglasses and perscription> [facility]

## 2014-03-12 NOTE — Consult Note (Signed)
[facility] Psychiatry Consult   Reason for Consult:  Benzodiazepine dependency Referring Physician:  EDP  Jennifer Cline is an 58 y.o. female. Total Time spent with patient: 20 minutes  Assessment: AXIS I:  Anxiety Disorder NOS, Depressive Disorder NOS and benzodiazepine dependency AXIS II:  Deferred AXIS III:   Past Medical History  Diagnosis Date  . [facility] spotted fever   . Gastritis   . Diverticulitis    AXIS IV:  occupational problems, other psychosocial or environmental problems, problems related to social environment and problems with primary support group AXIS V:  21-30 behavior considerably influenced by delusions or hallucinations OR serious impairment in judgment, communication OR inability to function in almost all areas  Plan:  Recommend psychiatric Inpatient admission when medically cleared. Dr. Louretta Shorten assessed the patient and concurs with the plan.  Subjective:   Jennifer Cline is a 58 y.o. female patient admitted with benzodiazepine detox.  HPI:  The patient was Rx doxycycline for [facility] Spotted Fever a few months ago and started having anxiety issues.  She was prescribed Xanax and her anxiety has progressively gotten worse to the point that she is depressed, not eating or sleeping, not functioning and can't work.  Denies suicidal/homicidal ideations, hallucinations, or alcohol issues.  Her husband is supportive and at her bedside.  Jennifer Cline wants to detox off benzodiazepines and take a medication that is not addictive for her anxiety and depression. HPI Elements:   Location:  generalized. Quality:  acute. Severity:  severe. Timing:  constant. Duration:  few months. Context:  stressors.  Past Psychiatric History: Past Medical History  Diagnosis Date  . [facility] spotted fever   . Gastritis   . Diverticulitis     reports that she has never smoked. She does not have any smokeless tobacco history on file. She reports that she does  not drink alcohol or use illicit drugs. History reviewed. No pertinent family history.         Allergies:   Allergies  Allergen Reactions  . Phenergan [Promethazine Hcl] Nausea And Vomiting  . Doxycycline Anxiety    ACT Assessment Complete:  Yes:    Educational Status    Risk to Self: Risk to self with the past 6 months Is patient at risk for suicide?: Yes Substance abuse history and/or treatment for substance abuse?: No  Risk to Others:    Abuse:    Prior Inpatient Therapy:    Prior Outpatient Therapy:    Additional Information:                    Objective: Blood pressure 127/65, pulse 79, temperature 98.1 F (36.7 C), temperature source Oral, resp. rate 17, SpO2 96.00%.There is no height or weight on file to calculate BMI. Results for orders placed during the hospital encounter of 03/12/14 (from the past 72 hour(s))  CBC WITH DIFFERENTIAL     Status: None   Collection Time    03/12/14  8:57 AM      Result Value Ref Range   WBC 4.3  4.0 - 10.5 K/uL   RBC 5.04  3.87 - 5.11 MIL/uL   Hemoglobin 14.5  12.0 - 15.0 g/dL   HCT 42.2  36.0 - 46.0 %   MCV 83.7  78.0 - 100.0 fL   MCH 28.8  26.0 - 34.0 pg   MCHC 34.4  30.0 - 36.0 g/dL   RDW 13.4  11.5 - 15.5 %   Platelets 232  150 -  400 K/uL   Neutrophils Relative % 59  43 - 77 %   Neutro Abs 2.5  1.7 - 7.7 K/uL   Lymphocytes Relative 30  12 - 46 %   Lymphs Abs 1.3  0.7 - 4.0 K/uL   Monocytes Relative 9  3 - 12 %   Monocytes Absolute 0.4  0.1 - 1.0 K/uL   Eosinophils Relative 1  0 - 5 %   Eosinophils Absolute 0.0  0.0 - 0.7 K/uL   Basophils Relative 1  0 - 1 %   Basophils Absolute 0.0  0.0 - 0.1 K/uL  COMPREHENSIVE METABOLIC PANEL     Status: Abnormal   Collection Time    03/12/14  8:57 AM      Result Value Ref Range   Sodium 141  137 - 147 mEq/L   Potassium 4.0  3.7 - 5.3 mEq/L   Chloride 103  96 - 112 mEq/L   CO2 24  19 - 32 mEq/L   Glucose, Bld 101 (*) 70 - 99 mg/dL   BUN 4 (*) 6 - 23 mg/dL    Creatinine, Ser 0.62  0.50 - 1.10 mg/dL   Calcium 9.7  8.4 - 10.5 mg/dL   Total Protein 7.0  6.0 - 8.3 g/dL   Albumin 4.5  3.5 - 5.2 g/dL   AST 21  0 - 37 U/L   ALT 25  0 - 35 U/L   Alkaline Phosphatase 62  39 - 117 U/L   Total Bilirubin 3.3 (*) 0.3 - 1.2 mg/dL   GFR calc non Af Amer >90  >90 mL/min   GFR calc Af Amer >90  >90 mL/min   Comment: (NOTE)     The eGFR has been calculated using the CKD EPI equation.     This calculation has not been validated in all clinical situations.     eGFR's persistently <90 mL/min signify possible Chronic Kidney     Disease.   Anion gap 14  5 - 15  ETHANOL     Status: None   Collection Time    03/12/14  8:57 AM      Result Value Ref Range   Alcohol, Ethyl (B) <11  0 - 11 mg/dL   Comment:            LOWEST DETECTABLE LIMIT FOR     SERUM ALCOHOL IS 11 mg/dL     FOR MEDICAL PURPOSES ONLY  URINE RAPID DRUG SCREEN (HOSP PERFORMED)     Status: Abnormal   Collection Time    03/12/14  9:00 AM      Result Value Ref Range   Opiates NONE DETECTED  NONE DETECTED   Cocaine NONE DETECTED  NONE DETECTED   Benzodiazepines POSITIVE (*) NONE DETECTED   Amphetamines NONE DETECTED  NONE DETECTED   Tetrahydrocannabinol NONE DETECTED  NONE DETECTED   Barbiturates NONE DETECTED  NONE DETECTED   Comment:            DRUG SCREEN FOR MEDICAL PURPOSES     ONLY.  IF CONFIRMATION IS NEEDED     FOR ANY PURPOSE, NOTIFY LAB     WITHIN 5 DAYS.                LOWEST DETECTABLE LIMITS     FOR URINE DRUG SCREEN     Drug Class       Cutoff (ng/mL)     Amphetamine      1000     Barbiturate  200     Benzodiazepine   500     Tricyclics       938     Opiates          300     Cocaine          300     THC              50   Labs are reviewed and are pertinent for no medical issues noted.  Current Facility-Administered Medications  Medication Dose Route Frequency Provider Last Rate Last Dose  . acetaminophen (TYLENOL) tablet 650 mg  650 mg Oral Q4H PRN Ernestina Patches, MD      . alum & mag hydroxide-simeth (MAALOX/MYLANTA) 200-200-20 MG/5ML suspension 30 mL  30 mL Oral PRN Ernestina Patches, MD      . ibuprofen (ADVIL,MOTRIN) tablet 600 mg  600 mg Oral Q8H PRN Ernestina Patches, MD      . ondansetron (ZOFRAN) tablet 4 mg  4 mg Oral Q8H PRN Ernestina Patches, MD       Current Outpatient Prescriptions  Medication Sig Dispense Refill  . ALPRAZolam (XANAX) 0.5 MG tablet Take 0.5 mg by mouth 3 (three) times daily.       . mirtazapine (REMERON) 15 MG tablet Take 15 mg by mouth at bedtime.         Psychiatric Specialty Exam:     Blood pressure 127/65, pulse 79, temperature 98.1 F (36.7 C), temperature source Oral, resp. rate 17, SpO2 96.00%.There is no height or weight on file to calculate BMI.  General Appearance: Disheveled  Eye Sport and exercise psychologist::  Fair  Speech:  Normal Rate  Volume:  Normal  Mood:  Anxious and Depressed  Affect:  Congruent  Thought Process:  Coherent  Orientation:  Full (Time, Place, and Person)  Thought Content:  WDL  Suicidal Thoughts:  No  Homicidal Thoughts:  No  Memory:  Immediate;   Fair Recent;   Fair Remote;   Fair  Judgement:  Fair  Insight:  Fair  Psychomotor Activity:  Decreased  Concentration:  Fair  Recall:  [employer] of Knowledge:Fair  Language: Fair  Akathisia:  No  Handed:  Right  AIMS (if indicated):     Assets:  Communication Skills Desire for Improvement Financial Resources/Insurance Housing Intimacy Leisure Time [place] Talents/Skills Transportation Vocational/Educational  Sleep:      Musculoskeletal: Strength & Muscle Tone: within normal limits Gait & Station: normal Patient leans: N/A  Treatment Plan Summary: Daily contact with patient to assess and evaluate symptoms and progress in treatment Medication management; Librium benzodiazepine detox protocol in place, admit to inpatient hospital for detox from benzodiazepines.  Waylan Boga, [place] 03/12/2014  1:29 PM  Patient is seen face-to-face for psychiatric evaluation and case discussed with physician extender and treatment team, and formulated treatment plan. Reviewed the information documented and agree with the treatment plan.  Adina Puzzo,JANARDHAHA R. 03/12/2014 6:14 PM

## 2014-03-12 NOTE — ED Notes (Signed)
Pt transferred to rm 40. Vw, rn.

## 2014-03-12 NOTE — ED Notes (Signed)
PA Charles at bedside to speak with pt.

## 2014-03-12 NOTE — ED Notes (Signed)
Pt reports being on xanax for past 3 months after many medical issues started causing anxiety and panic attacks. Sts she started shaking this am and has taken a total of 0.75mg  today. Sts she wants to get off of the xanax. Sts she is so depressed, she doesn't want to live. No plan. No HI. Sts she just can't function like this. Pt has visible body tremors. Insomnia for weeks. +nausea.

## 2014-03-12 NOTE — ED Notes (Signed)
Brief report given to [employer]. Pt to be moved to room 26

## 2014-03-12 NOTE — H&P (Signed)
ADMISSION TO OBSERVATION            History    First MD Initiated Contact with Patient 03/12/14 [phone]       Chief Complaint   Patient presents with   .  Withdrawal      (Consider location/radiation/quality/duration/timing/severity/associated sxs/prior Treatment) Patient is a 58 y.o. female presenting with mental health disorder. The history is provided by the patient. No language interpreter was used.  Mental Health Problem Presenting symptoms: depression and suicidal thoughts   Presenting symptoms: no agitation   Presenting symptoms comment:  Anxiety  Degree of incapacity (severity):  Severe Onset quality:  Gradual Duration:  2 months Timing:  Constant Progression:  Worsening Chronicity:  New Context: stressful life event   Relieved by:  Nothing Worsened by:  Nothing tried Ineffective treatments:  Anti-anxiety medications Associated symptoms: anhedonia, anxiety, appetite change, insomnia and weight change   Associated symptoms: no abdominal pain, no chest pain, no fatigue and no headaches   Risk factors: no family hx of mental illness, no family violence, no hx of mental illness, no hx of suicide attempts, no neurological disease and no recent psychiatric admission      Past Medical History   Diagnosis  Date   .  [facility] spotted fever     .  Gastritis     .  Diverticulitis        Past Surgical History   Procedure  Laterality  Date   .  Appendectomy        History reviewed. No pertinent family history. History   Substance Use Topics   .  Smoking status:  Never Smoker    .  Smokeless tobacco:  Not on file   .  Alcohol Use:  No      OB History     Grav  Para  Term  Preterm  Abortions  TAB  SAB  Ect  Mult  Living                              Review of Systems  Constitutional: Positive for appetite change. Negative for fever, chills, diaphoresis,  activity change and fatigue.  HENT: Negative for congestion, facial swelling, rhinorrhea and sore throat.   Eyes: Negative for photophobia and discharge.  Respiratory: Negative for cough, chest tightness and shortness of breath.   Cardiovascular: Negative for chest pain, palpitations and leg swelling.  Gastrointestinal: Negative for nausea, vomiting, abdominal pain and diarrhea.  Endocrine: Negative for polydipsia and polyuria.  Genitourinary: Negative for dysuria, frequency, difficulty urinating and pelvic pain.  Musculoskeletal: Negative for arthralgias, back pain, neck pain and neck stiffness.  Skin: Negative for color change and wound.  Allergic/Immunologic: Negative for immunocompromised state.  Neurological: Negative for facial asymmetry, weakness, numbness and headaches.  Hematological: Does not bruise/bleed easily.  Psychiatric/Behavioral: Positive for  suicidal ideas. Negative for confusion and agitation. The patient is nervous/anxious and has insomnia.         Allergies   Phenergan and Doxycycline    Home Medications      Prior to Admission medications    Medication  Sig  Start Date  End Date  Taking?  Authorizing Provider   ALPRAZolam Duanne Moron) 0.5 MG tablet  Take 0.5 mg by mouth 3 (three) times daily.   02/28/14    Yes  Historical Provider, MD   mirtazapine (REMERON) 15 MG tablet  Take 15 mg by mouth at bedtime.   03/10/14      Historical Provider, MD    BP 127/65  Pulse 79  Temp(Src) 98.1 F (36.7 C) (Oral)  Resp 17  SpO2 96% Physical Exam  Constitutional: She is oriented to person, place, and time. She appears well-developed and well-nourished. No distress.  HENT:   Head: Normocephalic and atraumatic.   Mouth/Throat: No oropharyngeal exudate.  Eyes: Pupils are equal, round, and reactive to light.  Neck: Normal range of motion. Neck supple.  Cardiovascular: Normal rate, regular rhythm and normal heart sounds.  Exam reveals no gallop and no friction rub.    No murmur  heard. Pulmonary/Chest: Effort normal and breath sounds normal. No respiratory distress. She has no wheezes. She has no rales.  Abdominal: Soft. Bowel sounds are normal. She exhibits no distension and no mass. There is no tenderness. There is no rebound and no guarding.  Musculoskeletal: Normal range of motion. She exhibits no edema and no tenderness.  Neurological: She is alert and oriented to person, place, and time.  Skin: Skin is warm and dry.  Psychiatric: Her mood appears anxious. She exhibits a depressed mood. She expresses suicidal ideation.  tearful       03/12/2014 8:44 PM Jennifer Cline   MRN:  [id number] Subjective:  Request from Nurse to see pt .Requesting to leave after being transferred to obs unit earlier prior to my comming on.Pt is voluntary Diagnosis: Benxzodiazepene/xanax dependence   DSM5: Schizophrenia Disorders:  na Obsessive-Compulsive Disorders:  na Trauma-Stressor Disorders:  na Substance/Addictive Disorders:  Benzodiazepene dependence Depressive Disorders:  NA Total Time spent with patient: 15 minutes  Axis I: Benzodiazepene dependence (iatrogenic) Axis II: Deferred Axis III:   Past Medical History   Diagnosis  Date   .  [facility] spotted fever     .  Gastritis     .  Diverticulitis      Axis IV: other psychosocial or environmental problems Axis V: 41-50 serious symptoms  ADL's:  Intact  Sleep: Poor  Appetite: Fair  Suicidal Ideation:   Intent:  none Homicidal Ideation:   Intent:  none AEB (as evidenced by):  Psychiatric Specialty Exam: Physical Exam  Nursing note and vitals reviewed. Constitutional: She appears well-developed and well-nourished. She appears distressed (anxious-c/o of withdrawal symptoms.Told nurse she wanted to go home tather than transfer to OBS).  HENT:   Head: Normocephalic and atraumatic.  Right Ear: External ear normal.  Left Ear: External ear normal.  C/o of sinus/allergy requests sudafed  Eyes:  Conjunctivae and EOM are normal. Pupils are equal, round, and reactive to light. Right eye exhibits no discharge. Left eye exhibits no discharge. No scleral icterus.  Neck: Normal range of motion. Neck supple. No JVD present. No tracheal deviation present.  Cardiovascular: Normal rate and regular rhythm.   Respiratory: Effort normal and breath sounds normal. No stridor.  GI:  Deferred  Genitourinary:  cancel  Musculoskeletal: Normal range of motion.  Neurological: She is alert. No cranial nerve deficit. She exhibits normal muscle tone. Coordination normal.  Skin: Skin is warm and dry.  Psychiatric:  See PSE     ROS   Blood pressure 113/58, pulse 88, temperature 97.9 F (36.6 C), temperature source Oral, resp. rate 16, SpO2 99.00%.There is no height or weight on file to calculate BMI.   General Appearance: Fairly Groomed   Engineer, water::  Good   Speech:  Clear and Coherent   Volume:  Normal   Mood:  Dysphoric   Affect:  Congruent   Thought Process:  Goal Directed and Intact   Orientation:  Full (Time, Place, and Person)   Thought Content:  WDL   Suicidal Thoughts:  No   Homicidal Thoughts:  No   Memory:  Negative   Judgement:  Fair   Insight:  Fair   Psychomotor Activity:  Normal   Concentration:  Fair   Recall:  [employer] of Knowledge:Fair   Language: Fair   Akathisia:  NA   Handed:  Right   AIMS (if indicated):     Assets:  Desire for Improvement  Financial Resources/Insurance Intimacy Social Support   Sleep:  Unable to get good night's sleep with xanax withdrawal    Musculoskeletal: Strength & Muscle Tone: within normal limits Gait & Station: normal Patient leans: N/A  Current Medications: Current Facility-Administered Medications   Medication  Dose  Route  Frequency  Provider  Last Rate  Last Dose   .  acetaminophen (TYLENOL) tablet 650 mg   650 mg  Oral  Q4H PRN  Ernestina Patches, MD         .  alum & mag hydroxide-simeth (MAALOX/MYLANTA) 200-200-20 MG/5ML  suspension 30 mL   30 mL  Oral  PRN  Ernestina Patches, MD         .  chlordiazePOXIDE (LIBRIUM) capsule 25 mg   25 mg  Oral  Q6H PRN  Waylan Boga, NP         .  chlordiazePOXIDE (LIBRIUM) capsule 25 mg   25 mg  Oral  QID  Waylan Boga, NP     25 mg at 03/12/14 1817     Followed by   .  [START ON 03/13/2014] chlordiazePOXIDE (LIBRIUM) capsule 25 mg   25 mg  Oral  TID  Waylan Boga, NP           Followed by   .  [START ON 03/14/2014] chlordiazePOXIDE (LIBRIUM) capsule 25 mg   25 mg  Oral  BH-qamhs  Waylan Boga, NP           Followed by   .  [START ON 03/15/2014] chlordiazePOXIDE (LIBRIUM) capsule 25 mg   25 mg  Oral  Daily  Waylan Boga, NP         .  hydrOXYzine (ATARAX/VISTARIL) tablet 25 mg   25 mg  Oral  Q6H PRN  Waylan Boga, NP         .  ibuprofen (ADVIL,MOTRIN) tablet 600 mg   600 mg  Oral  Q8H PRN  Ernestina Patches, MD         .  loperamide (IMODIUM) capsule 2-4 mg   2-4 mg  Oral  PRN  Waylan Boga, NP         .  mirtazapine (REMERON) tablet 7.5 mg   7.5 mg  Oral  QHS  Waylan Boga, NP         .  multivitamin with minerals tablet 1 tablet   1 tablet  Oral  Daily  Waylan Boga, NP     1 tablet at 03/12/14 1450   .  ondansetron (ZOFRAN) tablet 4 mg   4 mg  Oral  Q8H PRN  Ernestina Patches, MD         .  ondansetron (ZOFRAN-ODT) disintegrating tablet 4 mg   4 mg  Oral  Q6H PRN  Waylan Boga, NP     4 mg at 03/12/14 1440   .  pseudoephedrine (SUDAFED) tablet 30 mg   30 mg  Oral  Q8H PRN  Dara Hoyer, PA-C         .  [START ON 03/13/2014] thiamine (VITAMIN B-1) tablet 100 mg   100 mg  Oral  Daily  Waylan Boga, NP            Current Outpatient Prescriptions   Medication  Sig  Dispense  Refill   .  ALPRAZolam (XANAX) 0.5 MG tablet  Take 0.5 mg by mouth 3 (three) times daily.          .  mirtazapine (REMERON) 15 MG tablet  Take 15 mg by mouth at bedtime.            Lab Results:   Results for orders placed during the hospital encounter of 03/12/14 (from the past 48 hour(s))   CBC WITH  DIFFERENTIAL     Status: None     Collection Time      03/12/14  8:57 AM       Result  Value  Ref Range     WBC  4.3   4.0 - 10.5 K/uL     RBC  5.04   3.87 - 5.11 MIL/uL     Hemoglobin  14.5   12.0 - 15.0 g/dL     HCT  42.2   36.0 - 46.0 %     MCV  83.7   78.0 - 100.0 fL     MCH  28.8   26.0 - 34.0 pg     MCHC  34.4   30.0 - 36.0 g/dL     RDW  13.4   11.5 - 15.5 %     Platelets  232   150 - 400 K/uL     Neutrophils Relative %  59   43 - 77 %     Neutro Abs  2.5   1.7 - 7.7 K/uL     Lymphocytes Relative  30   12 - 46 %     Lymphs Abs  1.3   0.7 - 4.0 K/uL     Monocytes Relative  9   3 - 12 %     Monocytes Absolute  0.4   0.1 - 1.0 K/uL     Eosinophils Relative  1   0 - 5 %     Eosinophils Absolute  0.0   0.0 - 0.7 K/uL     Basophils Relative  1   0 - 1 %     Basophils Absolute  0.0   0.0 - 0.1 K/uL   COMPREHENSIVE METABOLIC PANEL     Status: Abnormal     Collection Time      03/12/14  8:57 AM       Result  Value  Ref Range     Sodium  141   137 - 147 mEq/L     Potassium  4.0   3.7 - 5.3 mEq/L  Chloride  103   96 - 112 mEq/L     CO2  24   19 - 32 mEq/L     Glucose, Bld  101 (*)  70 - 99 mg/dL     BUN  4 (*)  6 - 23 mg/dL     Creatinine, Ser  0.62   0.50 - 1.10 mg/dL     Calcium  9.7   8.4 - 10.5 mg/dL     Total Protein  7.0   6.0 - 8.3 g/dL     Albumin  4.5   3.5 - 5.2 g/dL     AST  21   0 - 37 U/L     ALT  25   0 - 35 U/L     Alkaline Phosphatase  62   39 - 117 U/L     Total Bilirubin  3.3 (*)  0.3 - 1.2 mg/dL     GFR calc non Af Amer  >90   >90 mL/min     GFR calc Af Amer  >90   >90 mL/min     Comment:  (NOTE)        The eGFR has been calculated using the CKD EPI equation.        This calculation has not been validated in all clinical situations.        eGFR's persistently <90 mL/min signify possible Chronic Kidney        Disease.     Anion gap  14   5 - 15   ETHANOL     Status: None     Collection Time      03/12/14  8:57 AM       Result  Value  Ref Range      Alcohol, Ethyl (B)  <11   0 - 11 mg/dL     Comment:                LOWEST DETECTABLE LIMIT FOR        SERUM ALCOHOL IS 11 mg/dL        FOR MEDICAL PURPOSES ONLY   URINE RAPID DRUG SCREEN (HOSP PERFORMED)     Status: Abnormal     Collection Time      03/12/14  9:00 AM       Result  Value  Ref Range     Opiates  NONE DETECTED   NONE DETECTED     Cocaine  NONE DETECTED   NONE DETECTED     Benzodiazepines  POSITIVE (*)  NONE DETECTED     Amphetamines  NONE DETECTED   NONE DETECTED     Tetrahydrocannabinol  NONE DETECTED   NONE DETECTED     Barbiturates  NONE DETECTED   NONE DETECTED     Comment:                DRUG SCREEN FOR MEDICAL PURPOSES        ONLY.  IF CONFIRMATION IS NEEDED        FOR ANY PURPOSE, NOTIFY LAB        WITHIN 5 DAYS.                      LOWEST DETECTABLE LIMITS        FOR URINE DRUG SCREEN        Drug Class       Cutoff (ng/mL)        Amphetamine      1000  Barbiturate      200        Benzodiazepine   606        Tricyclics       770        Opiates          300        Cocaine          300        THC              50     Physical Findings: AIMS: NA CIWA: 6 COWS: NA   Treatment Plan Summary: Daily contact with patient to assess and evaluate symptoms and progress in treatment Medication management  Plan:Pt agrees to stay and transfer to observation unit and continue detox protocol .She also requests sudafed for sinus/allergies  Medical Decision Making Problem Points:  Established problem, worsening (2) Data Points:  Review or order clinical lab tests (1) Review and summation of old records (2)  I certify that inpatient services furnished can reasonably be expected to improve the patient's condition.    Jennifer Cline

## 2014-03-12 NOTE — ED Notes (Signed)
Report called to RN Britt Boozer, Observation Unit, [facility].  Pending [facility] transport.

## 2014-03-13 ENCOUNTER — Encounter (HOSPITAL_COMMUNITY): Payer: Self-pay | Admitting: *Deleted

## 2014-03-13 DIAGNOSIS — F1320 Sedative, hypnotic or anxiolytic dependence, uncomplicated: Secondary | ICD-10-CM | POA: Diagnosis not present

## 2014-03-13 DIAGNOSIS — F13239 Sedative, hypnotic or anxiolytic dependence with withdrawal, unspecified: Secondary | ICD-10-CM | POA: Diagnosis present

## 2014-03-13 DIAGNOSIS — F13939 Sedative, hypnotic or anxiolytic use, unspecified with withdrawal, unspecified: Secondary | ICD-10-CM | POA: Diagnosis present

## 2014-03-13 MED ORDER — CHLORDIAZEPOXIDE HCL 25 MG PO CAPS: 25.0000 mg | ORAL_CAPSULE | ORAL | Status: DC

## 2014-03-13 MED ORDER — LOPERAMIDE HCL 2 MG PO CAPS: 2.0000 mg | ORAL_CAPSULE | ORAL | Status: DC | PRN

## 2014-03-13 MED ORDER — MIRTAZAPINE 15 MG PO TABS
15.0000 mg | ORAL_TABLET | Freq: Every day | ORAL | Status: DC
  Administered 2014-03-13: 15 mg via ORAL
  Filled 2014-03-13 (×3): qty 1

## 2014-03-13 MED ORDER — CALCIUM CARBONATE-VITAMIN D 500-200 MG-UNIT PO TABS
2.0000 | ORAL_TABLET | Freq: Two times a day (BID) | ORAL | Status: DC
  Administered 2014-03-13 (×2): 2 via ORAL
  Filled 2014-03-13 (×6): qty 2

## 2014-03-13 MED ORDER — NON FORMULARY
180.0000 mg | Freq: Every morning | Status: DC
Start: 2014-03-13 — End: 2014-03-13

## 2014-03-13 MED ORDER — CHLORDIAZEPOXIDE HCL 25 MG PO CAPS
25.0000 mg | ORAL_CAPSULE | Freq: Four times a day (QID) | ORAL | Status: DC
  Administered 2014-03-13 (×4): 25 mg via ORAL
  Filled 2014-03-13 (×4): qty 1

## 2014-03-13 MED ORDER — PSEUDOEPHEDRINE HCL ER 120 MG PO TB12: 120.0000 mg | ORAL_TABLET | Freq: Every day | ORAL | Status: DC | PRN

## 2014-03-13 MED ORDER — FEXOFENADINE HCL 180 MG PO TABS: 180.0000 mg | ORAL_TABLET | Freq: Every day | ORAL | Status: DC

## 2014-03-13 MED ORDER — MAGNESIUM HYDROXIDE 400 MG/5ML PO SUSP: 30.0000 mL | Freq: Every day | ORAL | Status: DC | PRN

## 2014-03-13 MED ORDER — ADULT MULTIVITAMIN W/MINERALS CH
1.0000 | ORAL_TABLET | Freq: Every day | ORAL | Status: DC
Start: 2014-03-13 — End: 2014-03-14
  Administered 2014-03-13: 1 via ORAL
  Filled 2014-03-13 (×3): qty 1

## 2014-03-13 MED ORDER — CIPROFLOXACIN HCL 500 MG PO TABS
500.0000 mg | ORAL_TABLET | Freq: Two times a day (BID) | ORAL | Status: DC
  Administered 2014-03-13: 500 mg via ORAL
  Filled 2014-03-13 (×3): qty 1

## 2014-03-13 MED ORDER — VITAMIN B-1 100 MG PO TABS
100.0000 mg | ORAL_TABLET | Freq: Every day | ORAL | Status: DC
Start: 2014-03-14 — End: 2014-03-14
  Filled 2014-03-13 (×2): qty 1

## 2014-03-13 MED ORDER — HYDROXYZINE HCL 25 MG PO TABS
25.0000 mg | ORAL_TABLET | Freq: Four times a day (QID) | ORAL | Status: DC | PRN
  Administered 2014-03-14: 25 mg via ORAL
  Filled 2014-03-13: qty 1

## 2014-03-13 MED ORDER — CALCIUM CITRATE-VITAMIN D 500-400 MG-UNIT PO CHEW
2.0000 | CHEWABLE_TABLET | Freq: Two times a day (BID) | ORAL | Status: DC
  Filled 2014-03-13 (×3): qty 2

## 2014-03-13 MED ORDER — ONDANSETRON 4 MG PO TBDP
4.0000 mg | ORAL_TABLET | Freq: Four times a day (QID) | ORAL | Status: DC | PRN
  Administered 2014-03-13 (×2): 4 mg via ORAL
  Filled 2014-03-13 (×3): qty 1

## 2014-03-13 MED ORDER — ACETAMINOPHEN 325 MG PO TABS
650.0000 mg | ORAL_TABLET | Freq: Four times a day (QID) | ORAL | Status: DC | PRN
  Administered 2014-03-14: 650 mg via ORAL
  Filled 2014-03-13: qty 2

## 2014-03-13 MED ORDER — ALUM & MAG HYDROXIDE-SIMETH 200-200-20 MG/5ML PO SUSP: 30.0000 mL | ORAL | Status: DC | PRN

## 2014-03-13 MED ORDER — CHLORDIAZEPOXIDE HCL 25 MG PO CAPS
25.0000 mg | ORAL_CAPSULE | Freq: Four times a day (QID) | ORAL | Status: DC | PRN
Start: 2014-03-13 — End: 2014-03-14
  Administered 2014-03-13: 25 mg via ORAL
  Filled 2014-03-13: qty 1

## 2014-03-13 MED ORDER — CHLORDIAZEPOXIDE HCL 25 MG PO CAPS: 25.0000 mg | ORAL_CAPSULE | Freq: Three times a day (TID) | ORAL | Status: DC

## 2014-03-13 MED ORDER — THIAMINE HCL 100 MG/ML IJ SOLN
100.0000 mg | Freq: Once | INTRAMUSCULAR | Status: AC
Start: 2014-03-13 — End: 2014-03-13
  Administered 2014-03-13: 100 mg via INTRAMUSCULAR
  Filled 2014-03-13: qty 2

## 2014-03-13 MED ORDER — CHLORDIAZEPOXIDE HCL 25 MG PO CAPS: 25.0000 mg | ORAL_CAPSULE | Freq: Every day | ORAL | Status: DC

## 2014-03-13 NOTE — Progress Notes (Signed)
D: Met patient sleeping. Woke up and complained of mild shaking and slight headache. Patient refused Acetaminophen offered for headache. Due medications given as ordered. Patient denied SI, AVH. Patient states that she feels a lot better. Cheerful and pleasant. Offered nourishments and drinks. A: Encouragement and support offered. Encouraged continous compliant with the treatment plans. R: Patient very receptive. Accepted her due meds. Will continue to monitor patient.

## 2014-03-13 NOTE — Progress Notes (Signed)
Nursing Shift Note Obs Pt displays anxious and fidgety behaviors. Assertive with needs flat affect. Tearful at times. "I want to get off Xanax". Cooperative with staff. Depressed mood. Medication compliant. Complaints of nausea, med administered. Support and encouragement given. Will continue to monitor and stabilize.

## 2014-03-13 NOTE — Plan of Care (Signed)
[facility] Observation Crisis Plan  Reason for Crisis Plan:  Crisis Stabilization   Plan of Care:  Referral for Telepsychiatry/Psychiatric Consult  Family Support:   yes husband   Current Living Environment:   private resident  Insurance:   Hospital Account   Name Acct ID Class Status Primary Coverage   Tareva, Leske [id number] BEHAVIORAL HEALTH OBSERVATION Open BLUE CROSS BLUE SHIELD - [employer] [place] PPO        Guarantor Account (for Hospital Account [id number])   Name Relation to Pt Service Area Active? Acct Type   Illene Regulus Self [facility] Yes Behavioral Health   Address Phone       [address] [place], [place] [address] [phone](H) [phone])          Coverage Information (for Hospital Account [id number])   F/O Payor/Plan Precert #   [facility] SHIELD/BCBS [place] PPO    Subscriber Subscriber #   Camri, Molloy [id number]   Address Phone   [address] [place], [place] [address] [phone]      Legal Guardian:   self  Primary Care Provider:  Arlan Organ., MD  Current Outpatient Providers:  none  Psychiatrist:   none  Counselor/Therapist:   none  Compliant with Medications:  Yes  Additional Information:   Earleen Newport 9/13/20151:15 AM

## 2014-03-13 NOTE — Progress Notes (Signed)
[facility] INPATIENT:  Family/Significant Other Suicide Prevention Education  Suicide Prevention Education:  Patient Refusal for Family/Significant Other Suicide Prevention Education: The patient Jennifer Cline has refused to provide written consent for family/significant other to be provided Family/Significant Other Suicide Prevention Education during admission and/or prior to discharge.  Physician notified.  Glenice Laine B 03/13/2014, 1:19 AM

## 2014-03-13 NOTE — Progress Notes (Signed)
Patient ID: Jennifer Cline, female   DOB: [birth year], 58 y.o.   MRN: [id number] [facility] Observation unit  Progress Note  03/13/2014 9:56 AM Jennifer Cline  MRN:  [id number] Subjective:   Patient was seen this am on rounds in the observation unit.  Patient was tearful narratting how her PMD have made her become a "drug user"  Patient stated that she has been tried on various medications by her PMD and that he keeps adding or increasing the doses that he made her feel like a "Zombie" in her head.  Patient reports that she only felt depressed for once when she was given three different medical diagnosis is a short period and was placed on antibiotic.  She denied MH hx or medication use in the past. Patient states she has a great family, husband and grown children and starting all these medications have gotten her confused.  She is asking for gradual and safe detox off all medications from her PMD namely Xanax, Remeron and and all antibiotics.   Patient is asking for sudafed for her sinusitis.  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  We will continue to monitor patient and provide safety.  Her Remeron will be discontinued with any other medication patient does not want to take.  Diagnosis: Benxzodiazepene/xanax dependence  DSM5: Schizophrenia Disorders:  na Obsessive-Compulsive Disorders:  na Trauma-Stressor Disorders:  na Substance/Addictive Disorders:  Benzodiazepene dependence Depressive Disorders:  NA Total Time spent with patient: 25 minutes  Axis I: Benzodiazepene dependence (iatrogenic) Axis II: Deferred Axis III:  Past Medical History  Diagnosis Date  . [facility] spotted fever   . Gastritis   . Diverticulitis    Axis IV: other psychosocial or environmental problems Axis V: 41-50 serious symptoms  ADL's:  Intact  Sleep: Poor  Appetite:  Fair  Suicidal Ideation:  Denies Homicidal Ideation:  Denies  Psychiatric  Specialty Exam: Physical Exam  Nursing note and vitals reviewed. Constitutional: She appears well-developed and well-nourished. She appears distressed (anxious-c/o of withdrawal symptoms.Told nurse she wanted to go home tather than transfer to OBS).  HENT:  Head: Normocephalic and atraumatic.  Right Ear: External ear normal.  Left Ear: External ear normal.  C/o of sinus/allergy requests sudafed  Eyes: Conjunctivae and EOM are normal. Pupils are equal, round, and reactive to light. Right eye exhibits no discharge. Left eye exhibits no discharge. No scleral icterus.  Neck: Normal range of motion. Neck supple. No JVD present. No tracheal deviation present.  Cardiovascular: Normal rate and regular rhythm.   Respiratory: Effort normal and breath sounds normal. No stridor.  GI:  Deferred  Genitourinary:  cancel  Musculoskeletal: Normal range of motion.  Neurological: She is alert. No cranial nerve deficit. She exhibits normal muscle tone. Coordination normal.  Skin: Skin is warm and dry.  Psychiatric:  See PSE    ROS  Blood pressure 109/69, pulse 55, temperature 97.7 F (36.5 C), temperature source Oral, resp. rate 18, height 5' (1.524 m), weight 61.236 kg (135 lb), SpO2 100.00%.Body mass index is 26.37 kg/(m^2).  General Appearance: Fairly Groomed  Engineer, water::  Good  Speech:  Clear and Coherent  Volume:  Normal  Mood:  Dysphoric  Affect:  Congruent  Thought Process:  Goal Directed and Intact  Orientation:  Full (Time, Place, and Person)  Thought Content:  WDL  Suicidal Thoughts:  No  Homicidal Thoughts:  No  Memory:  Negative  Judgement:  Fair  Insight:  Fair  Psychomotor Activity:  Normal  Concentration:  Fair  Recall:  [employer] of Knowledge:Fair  Language: Fair  Akathisia:  NA  Handed:  Right  AIMS (if indicated):     Assets:  Desire for Improvement Financial Resources/Insurance Intimacy Social Support  Sleep:  Unable to get good night's sleep with xanax  withdrawal   Musculoskeletal: Strength & Muscle Tone: within normal limits Gait & Station: normal Patient leans: N/A  Current Medications: Current Facility-Administered Medications  Medication Dose Route Frequency Provider Last Rate Last Dose  . acetaminophen (TYLENOL) tablet 650 mg  650 mg Oral Q6H PRN Dara Hoyer, PA-C      . alum & mag hydroxide-simeth (MAALOX/MYLANTA) 200-200-20 MG/5ML suspension 30 mL  30 mL Oral Q4H PRN Dara Hoyer, PA-C      . calcium-vitamin D (OSCAL WITH D) 500-200 MG-UNIT per tablet 2 tablet  2 tablet Oral BID Hampton Abbot, MD   2 tablet at 03/13/14 3559  . chlordiazePOXIDE (LIBRIUM) capsule 25 mg  25 mg Oral Q6H PRN Dara Hoyer, PA-C   25 mg at 03/13/14 7416  . chlordiazePOXIDE (LIBRIUM) capsule 25 mg  25 mg Oral QID Dara Hoyer, PA-C   25 mg at 03/13/14 3845   Followed by  . [START ON 03/14/2014] chlordiazePOXIDE (LIBRIUM) capsule 25 mg  25 mg Oral TID Dara Hoyer, PA-C       Followed by  . [START ON 03/15/2014] chlordiazePOXIDE (LIBRIUM) capsule 25 mg  25 mg Oral BH-qamhs Dara Hoyer, PA-C       Followed by  . [START ON 03/17/2014] chlordiazePOXIDE (LIBRIUM) capsule 25 mg  25 mg Oral Daily Dara Hoyer, PA-C      . ciprofloxacin (CIPRO) tablet 500 mg  500 mg Oral BID Dara Hoyer, PA-C   500 mg at 03/13/14 3646  . fexofenadine (ALLEGRA) tablet 180 mg  180 mg Oral Daily Hampton Abbot, MD      . hydrOXYzine (ATARAX/VISTARIL) tablet 25 mg  25 mg Oral Q6H PRN Dara Hoyer, PA-C      . loperamide (IMODIUM) capsule 2-4 mg  2-4 mg Oral PRN Dara Hoyer, PA-C      . magnesium hydroxide (MILK OF MAGNESIA) suspension 30 mL  30 mL Oral Daily PRN Dara Hoyer, PA-C      . mirtazapine (REMERON) tablet 15 mg  15 mg Oral QHS Dara Hoyer, PA-C   15 mg at 03/13/14 0129  . multivitamin with minerals tablet 1 tablet  1 tablet Oral Daily Dara Hoyer, PA-C   1 tablet at 03/13/14 8032  . ondansetron (ZOFRAN-ODT) disintegrating tablet 4  mg  4 mg Oral Q6H PRN Dara Hoyer, PA-C   4 mg at 03/13/14 1224  . [START ON 03/14/2014] thiamine (VITAMIN B-1) tablet 100 mg  100 mg Oral Daily Dara Hoyer, PA-C        Lab Results:  Results for orders placed during the hospital encounter of 03/12/14 (from the past 48 hour(s))  CBC WITH DIFFERENTIAL     Status: None   Collection Time    03/12/14  8:57 AM      Result Value Ref Range   WBC 4.3  4.0 - 10.5 K/uL   RBC 5.04  3.87 - 5.11 MIL/uL   Hemoglobin 14.5  12.0 - 15.0 g/dL   HCT 42.2  36.0 - 46.0 %   MCV 83.7  78.0 - 100.0 fL  MCH 28.8  26.0 - 34.0 pg   MCHC 34.4  30.0 - 36.0 g/dL   RDW 13.4  11.5 - 15.5 %   Platelets 232  150 - 400 K/uL   Neutrophils Relative % 59  43 - 77 %   Neutro Abs 2.5  1.7 - 7.7 K/uL   Lymphocytes Relative 30  12 - 46 %   Lymphs Abs 1.3  0.7 - 4.0 K/uL   Monocytes Relative 9  3 - 12 %   Monocytes Absolute 0.4  0.1 - 1.0 K/uL   Eosinophils Relative 1  0 - 5 %   Eosinophils Absolute 0.0  0.0 - 0.7 K/uL   Basophils Relative 1  0 - 1 %   Basophils Absolute 0.0  0.0 - 0.1 K/uL  COMPREHENSIVE METABOLIC PANEL     Status: Abnormal   Collection Time    03/12/14  8:57 AM      Result Value Ref Range   Sodium 141  137 - 147 mEq/L   Potassium 4.0  3.7 - 5.3 mEq/L   Chloride 103  96 - 112 mEq/L   CO2 24  19 - 32 mEq/L   Glucose, Bld 101 (*) 70 - 99 mg/dL   BUN 4 (*) 6 - 23 mg/dL   Creatinine, Ser 0.62  0.50 - 1.10 mg/dL   Calcium 9.7  8.4 - 10.5 mg/dL   Total Protein 7.0  6.0 - 8.3 g/dL   Albumin 4.5  3.5 - 5.2 g/dL   AST 21  0 - 37 U/L   ALT 25  0 - 35 U/L   Alkaline Phosphatase 62  39 - 117 U/L   Total Bilirubin 3.3 (*) 0.3 - 1.2 mg/dL   GFR calc non Af Amer >90  >90 mL/min   GFR calc Af Amer >90  >90 mL/min   Comment: (NOTE)     The eGFR has been calculated using the CKD EPI equation.     This calculation has not been validated in all clinical situations.     eGFR's persistently <90 mL/min signify possible Chronic Kidney     Disease.    Anion gap 14  5 - 15  ETHANOL     Status: None   Collection Time    03/12/14  8:57 AM      Result Value Ref Range   Alcohol, Ethyl (B) <11  0 - 11 mg/dL   Comment:            LOWEST DETECTABLE LIMIT FOR     SERUM ALCOHOL IS 11 mg/dL     FOR MEDICAL PURPOSES ONLY  URINE RAPID DRUG SCREEN (HOSP PERFORMED)     Status: Abnormal   Collection Time    03/12/14  9:00 AM      Result Value Ref Range   Opiates NONE DETECTED  NONE DETECTED   Cocaine NONE DETECTED  NONE DETECTED   Benzodiazepines POSITIVE (*) NONE DETECTED   Amphetamines NONE DETECTED  NONE DETECTED   Tetrahydrocannabinol NONE DETECTED  NONE DETECTED   Barbiturates NONE DETECTED  NONE DETECTED   Comment:            DRUG SCREEN FOR MEDICAL PURPOSES     ONLY.  IF CONFIRMATION IS NEEDED     FOR ANY PURPOSE, NOTIFY LAB     WITHIN 5 DAYS.                LOWEST DETECTABLE LIMITS     FOR URINE DRUG  SCREEN     Drug Class       Cutoff (ng/mL)     Amphetamine      1000     Barbiturate      200     Benzodiazepine   428     Tricyclics       768     Opiates          300     Cocaine          300     THC              50    Physical Findings: AIMS:  , ,  ,  ,    CIWA:  CIWA-Ar Total: 12 COWS:     Treatment Plan Summary: Daily contact with patient to assess and evaluate symptoms and progress in treatment Medication management  Plan: Patient is asking to be taken off all home medicines except her sudafed for her sinus problem.  Patient want a gradual detox off xanax and she does not want any antidepressant , sleep medicines or any anxiolytics.  Patient will remain in our Observation unit while we look for a bed for her.  Meanwhile, we will continue to offer her Librium and monitor her for Seizures.  Medical Decision Making Problem Points:  Established problem, worsening (2) Data Points:  Review or order clinical lab tests (1) Review and summation of old records (2)  I certify that observation unit services furnished can  reasonably be expected to improve the patient's condition.     Charmaine Downs, C  PMHNP-BC 03/13/2014, 9:56 AM i agree with plan.

## 2014-03-13 NOTE — [facility] Suicide Risk Assessment (Signed)
Suicide Risk Assessment  Admission Assessment     Nursing information obtained from:   chart Demographic factors:   see chart Current Mental Status:   Dysphoric but motivated to get off xanax Loss Factors:   health impaired by illness (RMSF) and adverse reaction to doxycycline and iatrogenig dependence on xanax with paradoxical reaction Historical Factors:   None Risk Reduction Factors:   Supportive husband Total Time spent with patient: 20 minutes  CLINICAL FACTORS:   Severe Anxiety and/or Agitation Medical Diagnoses and Treatments/Surgeries  Psychiatric Specialty Exam:     Blood pressure 138/79, pulse 78, temperature 98.7 F (37.1 C), temperature source Oral, resp. rate 16.There is no height or weight on file to calculate BMI.  General Appearance: Fairly Groomed  Patent attorney::  Good  Speech:  Clear and Coherent  Volume:  Normal  Mood:  Dysphoric  Affect:  Congruent  Thought Process:  Coherent and Goal Directed  Orientation:  Full (Time, Place, and Person)  Thought Content:  Rumination  Suicidal Thoughts:  Yes no plan  Homicidal Thoughts:  No  Memory:  Negative  Judgement:  Impaired  Insight:  Lacking  Psychomotor Activity:  Normal  Concentration:  Fair  Recall:  Good  Fund of Knowledge:Good  Language: Good  Akathisia:  NA  Handed:  Right  AIMS (if indicated):   NA  Assets:  Desire for Improvement Financial Resources/Insurance Social Support  Sleep:   Disturbed -see Admission H&P   Musculoskeletal: Strength & Muscle Tone: within normal limits Gait & Station: normal Patient leans: N/A  COGNITIVE FEATURES THAT CONTRIBUTE TO RISK:  Loss of executive function  Benzo dependence with withdrawal  SUICIDE RISK:   Mild:  Suicidal ideation of limited frequency, intensity, duration, and specificity.  There are no identifiable plans, no associated intent, mild dysphoria and related symptoms, good self-control (both objective and subjective assessment), few other risk  factors, and identifiable protective factors, including available and accessible social support.  PLAN OF CARE:  I certify that inpatient services furnished can reasonably be expected to improve the patient's condition.  Maryjean Morn E 03/13/2014, 12:17 AM

## 2014-03-13 NOTE — Progress Notes (Addendum)
F/u calls have been placed to the following facilities regarding pt's referral and inptx: Turner Daniels- calls placed but anable to contact Abran Cantor- reviewing referrals, will call TTS once review complete [facility]- per Jacki Cones referral has not been reviewed but they still have beds available [facility]- at capacity [facility]- per Maralyn Sago at capacity  In addition the following facilities have been contacted seeking bed availability and are at capacity: [facility]- per Kathaleen Bury- per Camille Bal- per Milagros Loll- per Janey Greaser- per [facility]- per Ermalinda Barrios- per Talmage Coin- per Tommy Rainwater- per [facility]- per [facility]- per Myrene Buddy [facility]- per [facility]- no longer take outside referrals [facility]- per Jerrye Noble- [facility]- per Radonna Ricker- per [facility]- per Patty  *pt has been declined at [facility] d/t primary problem being SA.  Tomi Bamberger Disposition MHT

## 2014-03-14 DIAGNOSIS — F1320 Sedative, hypnotic or anxiolytic dependence, uncomplicated: Secondary | ICD-10-CM | POA: Diagnosis not present

## 2014-03-14 MED ORDER — CALCIUM-VITAMIN D-VITAMIN K 500-1000-40 MG-UNT-MCG PO CHEW: 1.0000 | CHEWABLE_TABLET | Freq: Every morning | ORAL | Status: DC

## 2014-03-14 MED ORDER — FEXOFENADINE HCL 180 MG PO TABS: 180.0000 mg | ORAL_TABLET | Freq: Every day | ORAL | Status: DC

## 2014-03-14 MED ORDER — MIRTAZAPINE 15 MG PO TABS: 7.5000 mg | ORAL_TABLET | Freq: Every evening | ORAL | Status: DC | PRN

## 2014-03-14 MED ORDER — CHLORDIAZEPOXIDE HCL 25 MG PO CAPS: 25.0000 mg | ORAL_CAPSULE | Freq: Two times a day (BID) | ORAL | Status: DC | PRN

## 2014-03-14 MED ORDER — HYDROXYZINE HCL 25 MG PO TABS: 25.0000 mg | ORAL_TABLET | Freq: Four times a day (QID) | ORAL | Status: DC | PRN

## 2014-03-14 NOTE — Discharge Summary (Signed)
Patient ID: Jennifer Cline, female   DOB: 1956-03-22, 58 y.o.   MRN: [id number] [facility] OBS UNIT DISCHARGE SUMMARY  03/14/2014 10:07 AM CELES DEDIC  MRN:  [id number] Subjective:   Patient seen and chart reviewed. Pt denies SI, HI, and AVH, contracts for safety. Pt reports that she is feeling better and does not want to go inpatient at [facility]. Pt appears to be at her baseline at this time, demonstrates excellent insight in regard to her medical course and benzodiazepine dependence, and affirms desire to followup outpatient.   HPI: The patient was Rx doxycycline for [facility] Spotted Fever a few months ago and started having anxiety issues. She was prescribed Xanax and her anxiety has progressively gotten worse to the point that she is depressed, not eating or sleeping, not functioning and can't work. Denies suicidal/homicidal ideations, hallucinations, or alcohol issues. Her husband is supportive and at her bedside. [place] wants to detox off benzodiazepines and take a medication that is not addictive for her anxiety and depression.  Diagnosis: Benxzodiazepene/xanax dependence  DSM5: Schizophrenia Disorders:  na Obsessive-Compulsive Disorders:  na Trauma-Stressor Disorders:  na Substance/Addictive Disorders:  Benzodiazepene dependence Depressive Disorders:  NA Total Time spent with patient: 25 minutes  Axis I: Benzodiazepene dependence (iatrogenic) Axis II: Deferred Axis III:  Past Medical History  Diagnosis Date  . [facility] spotted fever   . Gastritis   . Diverticulitis    Axis IV: other psychosocial or environmental problems Axis V: 41-50 serious symptoms  ADL's:  Intact  Sleep: Poor  Appetite:  Fair  Suicidal Ideation:  Denies Homicidal Ideation:  Denies  Psychiatric Specialty Exam: I concur with physical exam below performed by Maryjean Morn, PA.  Physical Exam  Nursing note and vitals reviewed. Constitutional: She appears well-developed and well-nourished.  Distressed: anxious-c/o of withdrawal symptoms.Told nurse she wanted to go home tather than transfer to OBS.  HENT:  Head: Normocephalic and atraumatic.  Right Ear: External ear normal.  Left Ear: External ear normal.  C/o of sinus/allergy requests sudafed  Eyes: Conjunctivae and EOM are normal. Pupils are equal, round, and reactive to light. Right eye exhibits no discharge. Left eye exhibits no discharge. No scleral icterus.  Neck: Normal range of motion. Neck supple. No JVD present. No tracheal deviation present.  Cardiovascular: Normal rate and regular rhythm.   Respiratory: Effort normal and breath sounds normal. No stridor.  GI:  Deferred  Genitourinary:  cancel  Musculoskeletal: Normal range of motion.  Neurological: She is alert. No cranial nerve deficit. She exhibits normal muscle tone. Coordination normal.  Skin: Skin is warm and dry.  Psychiatric:  See PSE    Review of Systems  Constitutional: Negative.   HENT: Negative.   Eyes: Negative.   Respiratory: Negative.   Cardiovascular: Negative.   Gastrointestinal: Negative.   Genitourinary: Negative.   Musculoskeletal: Negative.   Skin: Negative.   Neurological: Negative.   Endo/Heme/Allergies: Negative.   Psychiatric/Behavioral: Positive for depression. The patient is nervous/anxious.     Blood pressure 138/79, pulse 78, temperature 97.7 F (36.5 C), temperature source Oral, resp. rate 18, height 5' (1.524 m), weight 61.236 kg (135 lb), SpO2 98.00%.Body mass index is 26.37 kg/(m^2).  General Appearance: Fairly Groomed  Patent attorney::  Good  Speech:  Clear and Coherent  Volume:  Normal  Mood:  Euthymic  Affect:  Congruent  Thought Process:  Goal Directed and Intact  Orientation:  Full (Time, Place, and Person)  Thought Content:  WDL  Suicidal Thoughts:  No  Homicidal Thoughts:  No  Memory:  Immediate;   Good Recent;   Good Remote;   Good  Judgement:  Fair  Insight:  Fair  Psychomotor Activity:  Normal   Concentration:  Fair  Recall:  [employer] of Knowledge:Fair  Language: Fair  Akathisia:  NA  Handed:  Right  AIMS (if indicated):     Assets:  Desire for Improvement Financial Resources/Insurance Intimacy Social Support  Sleep:  Unable to get good night's sleep with xanax withdrawal   Musculoskeletal: Strength & Muscle Tone: within normal limits Gait & Station: normal Patient leans: N/A  Current Medications: Current Facility-Administered Medications  Medication Dose Route Frequency Provider Last Rate Last Dose  . acetaminophen (TYLENOL) tablet 650 mg  650 mg Oral Q6H PRN Court Joy, PA-C      . alum & mag hydroxide-simeth (MAALOX/MYLANTA) 200-200-20 MG/5ML suspension 30 mL  30 mL Oral Q4H PRN Court Joy, PA-C      . calcium-vitamin D (OSCAL WITH D) 500-200 MG-UNIT per tablet 2 tablet  2 tablet Oral BID Nelly Rout, MD   2 tablet at 03/13/14 1801  . chlordiazePOXIDE (LIBRIUM) capsule 25 mg  25 mg Oral Q6H PRN Court Joy, PA-C   25 mg at 03/13/14 1610  . chlordiazePOXIDE (LIBRIUM) capsule 25 mg  25 mg Oral QID Court Joy, PA-C   25 mg at 03/13/14 2114   Followed by  . chlordiazePOXIDE (LIBRIUM) capsule 25 mg  25 mg Oral TID Court Joy, PA-C       Followed by  . [START ON 03/15/2014] chlordiazePOXIDE (LIBRIUM) capsule 25 mg  25 mg Oral BH-qamhs Court Joy, PA-C       Followed by  . [START ON 03/17/2014] chlordiazePOXIDE (LIBRIUM) capsule 25 mg  25 mg Oral Daily Court Joy, PA-C      . hydrOXYzine (ATARAX/VISTARIL) tablet 25 mg  25 mg Oral Q6H PRN Court Joy, PA-C      . loperamide (IMODIUM) capsule 2-4 mg  2-4 mg Oral PRN Court Joy, PA-C      . magnesium hydroxide (MILK OF MAGNESIA) suspension 30 mL  30 mL Oral Daily PRN Court Joy, PA-C      . multivitamin with minerals tablet 1 tablet  1 tablet Oral Daily Court Joy, PA-C   1 tablet at 03/13/14 9604  . ondansetron (ZOFRAN-ODT) disintegrating tablet 4 mg  4 mg Oral Q6H PRN  Court Joy, PA-C   4 mg at 03/13/14 1435  . pseudoephedrine (SUDAFED) 12 hr tablet 120 mg  120 mg Oral Daily PRN Earney Navy, NP      . thiamine (VITAMIN B-1) tablet 100 mg  100 mg Oral Daily Court Joy, PA-C        Lab Results:  No results found for this or any previous visit (from the past 48 hour(s)).  Physical Findings: AIMS:  , ,  ,  ,    CIWA:  CIWA-Ar Total: 1 COWS:     Treatment Plan Summary: Daily contact with patient to assess and evaluate symptoms and progress in treatment Medication management  Plan: Discharge home with outpatient resources for followup for psychiatry and therapy.  Medical Decision Making Problem Points:  Established problem, stable/improving (1) Data Points:  Review or order clinical lab tests (1) Review and summation of old records (2)  Beau Fanny, FNP-BC 03/14/2014, 10:07 AM  Evaluated the patient and agree with assessment and  plan Geralyn Flash A. Sabra Heck, M.D.

## 2014-03-14 NOTE — Progress Notes (Addendum)
Patient discharged with all belongings, AVS and prescriptions reviewed. No further questions voiced.

## 2014-03-14 NOTE — [facility] Suicide Risk Assessment (Signed)
Suicide Risk Assessment  Discharge Assessment     Demographic Factors:  Caucasian  Total Time spent with patient: 30 minutes  Psychiatric Specialty Exam:     Blood pressure 138/79, pulse 78, temperature 97.7 F (36.5 C), temperature source Oral, resp. rate 18, height 5' (1.524 m), weight 61.236 kg (135 lb), SpO2 98.00%.Body mass index is 26.37 kg/(m^2).  General Appearance: Fairly Groomed  Patent attorney::  Fair  Speech:  Clear and Coherent  Volume:  Normal  Mood:  Euthymic  Affect:  Appropriate  Thought Process:  Coherent and Goal Directed  Orientation:  Full (Time, Place, and Person)  Thought Content:  events that led to get her to be dependent on the Xanax, her readiness to be D/C home   Suicidal Thoughts:  No  Homicidal Thoughts:  No  Memory:  Immediate;   Fair Recent;   Fair Remote;   Fair  Judgement:  Fair  Insight:  Present  Psychomotor Activity:  Normal  Concentration:  Fair  Recall:  [employer] of Knowledge:NA  Language: Fair  Akathisia:  No  Handed:    AIMS (if indicated):     Assets:  Desire for Improvement Housing Social Support Transportation  Sleep:       Musculoskeletal: Strength & Muscle Tone: within normal limits Gait & Station: normal Patient leans: N/A   Mental Status Per Nursing Assessment::   On Admission:     Current Mental Status by Physician: In full contact with reality. There are no active S/S of withdrawal.    Loss Factors: NA  Historical Factors: NA  Risk Reduction Factors:   Sense of responsibility to family, Living with another person, especially a relative and Positive social support  Continued Clinical Symptoms: none   Cognitive Features That Contribute To Risk: none identified   Suicide Risk:  Minimal: No identifiable suicidal ideation.  Patients presenting with no risk factors but with morbid ruminations; may be classified as minimal risk based on the severity of the depressive symptoms  Discharge Diagnoses:    AXIS I:  Benzodiazepine Dependence AXIS II:  No diagnosis AXIS III:   Past Medical History  Diagnosis Date  . [facility] spotted fever   . Gastritis   . Diverticulitis    AXIS IV:  none identified AXIS V:  61-70 mild symptoms  Plan Of Care/Follow-up recommendations:  Activity:  as tolerated Diet:  heart healthy Will follow up with PCP Is patient on multiple antipsychotic therapies at discharge:  No   Has Patient had three or more failed trials of antipsychotic monotherapy by history:  No  Recommended Plan for Multiple Antipsychotic Therapies: NA    Jennifer Cline A 03/14/2014, 2:07 PM

## 2014-03-14 NOTE — BH Assessment (Addendum)
Per [facility] Pt has been accepted to [address] [place] Under Dr. Shawnie Dapper. Writer has notified RN Vevelyn Francois to give Nurse to Nurse report [phone]

## 2014-03-14 NOTE — Progress Notes (Addendum)
Patient has been accepted at [facility] under Dr. Shawnie Dapper. Call [phone].

## 2014-03-14 NOTE — BH Assessment (Signed)
Pt has been admitted to the observation unit but it also noted that patient has been accepted to [facility]. See Gretchen Portela epic note.   Glorious Peach, MS, LCASA Assessment Counselor

## 2014-03-14 NOTE — Progress Notes (Signed)
Patient is alert and oriented today with a happy mood and bright affect. Writer spent 30 minutes educating patient on benzodiazepines and antidepressants. Patient voiced that she would like to see a psychiatrist/NP for follow up to see ig she needs an antidepressant. Patient able to verbalize that she did not like the way she felt when taking Xanax. "I needed more and more for it to work." Patient will return to her home and husband. Patient medicated for a head ache rated at 8.

## 2014-03-14 NOTE — Discharge Instructions (Signed)
For your ongoing behavioral health needs, you may benefit from seeing a therapist.  Consider one of the following options, and call at your earliest convenience to schedule an intake appointment:       [facility]      [address]      [place], [place] [address]      [phone]      Ask for an intake appointment with a therapist.       Daiva Eves, LCSW      [address] [place], [place] [address]      [phone]       Evette Cristal, [facility]      [facility]      [address] [place], [place] [address]       [phone]

## 2014-03-14 NOTE — Plan of Care (Signed)
[facility] Observation Crisis Plan  Reason for Crisis Plan:  Medication Management   Plan of Care:  Referral for outpatient therapy  Family Support:    Spouse, adult children, mother, siblings, many others  Current Living Environment:  Living Arrangements: Spouse/significant other; pt can return home  Insurance:  [facility]   Name Acct ID Class Status Primary Coverage   [place], Ple[place]z [id number] BEHAVIORAL HEALTH OBSERVATION Open BLUE CROSS BLUE SHIELD - BCBS [place] PPO        Guarantor Account (for Hospital Account [id number])   Name Relation to Pt Service Area Active? Acct Type   Illene Regulus Self [facility] Yes Behavioral Health   Address Phone       [address] [place], [place] [address] [phone](H) [phone])          Coverage Information (for Hospital Account [id number])   F/O Payor/Plan Precert #   BLUE CROSS [place] SHIELD/BCBS [place] PPO    Subscriber Subscriber #   [place], [place] [id number]   Address Phone   [address] [place], [place] [address] [phone]      Legal Guardian:   Self  Primary Care Provider:  Arlan Organ., MD  Current Outpatient Providers:  None  Psychiatrist:   None  Counselor/Therapist:   None  Compliant with Medications:  Yes  Additional Information: After consulting with Claudette Head, NP it has been determined that pt does not present a life threatening danger to herself or others, and that psychiatric hospitalization is not indicated for her at this time.  She may benefit from routine outpatient treatment, but she is averse to psychiatry because she does not want to be prescribed psychotropic medications.  MH-IOP was also discussed with her, but she does not believe that she needs this level of care.  She is only willing to consider outpatient therapy at this time.  She wishes to consider this option at home and schedule an appointment at her own initiative if she feels that it is necessary.  Pt  will be provided with contact information for the [facility] in [place], with a recommendation that she request an intake appointment specifically for a therapist.  As alternatives she will be provided with contact information for [facility], LCSW and Vernell Leep, LPC.  Doylene Canning, MA Triage Specialist Raphael Gibney 9/14/20152:18 PM

## 2014-03-15 NOTE — Progress Notes (Signed)
Telephone call from patient stating she was discharged from Observation unit yesterday with a prescription of Librium for Xanax detox.  States she took one at about 6am and is now feeling really shaky.  States she was directed to take the medicine BID PRN and was advised to follow up with [facility].  I advised her to call their number, which I provided to her, and ask their advice on what to do.  She states she would call them right away.

## 2014-03-17 ENCOUNTER — Encounter: Payer: Self-pay | Admitting: Obstetrics & Gynecology

## 2014-03-27 ENCOUNTER — Emergency Department (HOSPITAL_COMMUNITY): Payer: BC Managed Care – PPO

## 2014-03-27 ENCOUNTER — Emergency Department (HOSPITAL_COMMUNITY)
Admission: EM | Admit: 2014-03-27 | Discharge: 2014-03-27 | Disposition: A | Discharge status: Home / Self Care | Payer: BC Managed Care – PPO | Attending: Emergency Medicine | Admitting: Emergency Medicine

## 2014-03-27 ENCOUNTER — Encounter (HOSPITAL_COMMUNITY): Payer: Self-pay | Admitting: Emergency Medicine

## 2014-03-27 DIAGNOSIS — Z79899 Other long term (current) drug therapy: Secondary | ICD-10-CM | POA: Insufficient documentation

## 2014-03-27 DIAGNOSIS — F419 Anxiety disorder, unspecified: Secondary | ICD-10-CM

## 2014-03-27 DIAGNOSIS — Z8619 Personal history of other infectious and parasitic diseases: Secondary | ICD-10-CM | POA: Insufficient documentation

## 2014-03-27 DIAGNOSIS — R110 Nausea: Secondary | ICD-10-CM | POA: Insufficient documentation

## 2014-03-27 DIAGNOSIS — F411 Generalized anxiety disorder: Secondary | ICD-10-CM | POA: Diagnosis present

## 2014-03-27 DIAGNOSIS — Z8719 Personal history of other diseases of the digestive system: Secondary | ICD-10-CM | POA: Diagnosis not present

## 2014-03-27 LAB — COMPREHENSIVE METABOLIC PANEL
ALK PHOS: 61 U/L (ref 39–117)
ALT: 16 U/L (ref 0–35)
AST: 15 U/L (ref 0–37)
Albumin: 4.7 g/dL (ref 3.5–5.2)
Anion gap: 14 (ref 5–15)
BUN: 5 mg/dL — AB (ref 6–23)
CO2: 24 mEq/L (ref 19–32)
Calcium: 10 mg/dL (ref 8.4–10.5)
Chloride: 103 mEq/L (ref 96–112)
Creatinine, Ser: 0.64 mg/dL (ref 0.50–1.10)
GLUCOSE: 98 mg/dL (ref 70–99)
POTASSIUM: 4.1 meq/L (ref 3.7–5.3)
Sodium: 141 mEq/L (ref 137–147)
TOTAL PROTEIN: 7.5 g/dL (ref 6.0–8.3)
Total Bilirubin: 2.5 mg/dL — ABNORMAL HIGH (ref 0.3–1.2)

## 2014-03-27 LAB — URINALYSIS, ROUTINE W REFLEX MICROSCOPIC
Bilirubin Urine: NEGATIVE
Glucose, UA: NEGATIVE mg/dL
Hgb urine dipstick: NEGATIVE
Ketones, ur: 15 mg/dL — AB
Leukocytes, UA: NEGATIVE
Nitrite: NEGATIVE
Protein, ur: NEGATIVE mg/dL
Specific Gravity, Urine: 1.003 — ABNORMAL LOW (ref 1.005–1.030)
Urobilinogen, UA: 0.2 mg/dL (ref 0.0–1.0)
pH: 5.5 (ref 5.0–8.0)

## 2014-03-27 LAB — CBC WITH DIFFERENTIAL/PLATELET
Basophils Absolute: 0 10*3/uL (ref 0.0–0.1)
Basophils Relative: 0 % (ref 0–1)
Eosinophils Absolute: 0 10*3/uL (ref 0.0–0.7)
Eosinophils Relative: 1 % (ref 0–5)
HCT: 45 % (ref 36.0–46.0)
Hemoglobin: 15.3 g/dL — ABNORMAL HIGH (ref 12.0–15.0)
Lymphocytes Relative: 37 % (ref 12–46)
Lymphs Abs: 2 10*3/uL (ref 0.7–4.0)
MCH: 28.5 pg (ref 26.0–34.0)
MCHC: 34 g/dL (ref 30.0–36.0)
MCV: 83.8 fL (ref 78.0–100.0)
Monocytes Absolute: 0.3 10*3/uL (ref 0.1–1.0)
Monocytes Relative: 6 % (ref 3–12)
Neutro Abs: 3.1 10*3/uL (ref 1.7–7.7)
Neutrophils Relative %: 56 % (ref 43–77)
Platelets: 291 10*3/uL (ref 150–400)
RBC: 5.37 MIL/uL — ABNORMAL HIGH (ref 3.87–5.11)
RDW: 13.6 % (ref 11.5–15.5)
WBC: 5.5 10*3/uL (ref 4.0–10.5)

## 2014-03-27 LAB — TROPONIN I: Troponin I: <0.3 ng/mL (ref <0.30)

## 2014-03-27 MED ORDER — LORAZEPAM 1 MG PO TABS
1.0000 mg | ORAL_TABLET | Freq: Once | ORAL | Status: AC
  Administered 2014-03-27: 1 mg via ORAL
  Filled 2014-03-27 (×2): qty 1

## 2014-03-27 MED ORDER — LORAZEPAM 1 MG PO TABS: 1.0000 mg | ORAL_TABLET | Freq: Three times a day (TID) | ORAL | Status: DC | PRN

## 2014-03-27 NOTE — Discharge Instructions (Signed)
Followup with your primary care physician. Use Ativan 1 mg (one pill) every 8 hours as needed for anxiousness.   Panic Attacks Panic attacks are sudden, short-livedsurges of severe anxiety, fear, or discomfort. They may occur for no reason when you are relaxed, when you are anxious, or when you are sleeping. Panic attacks may occur for a number of reasons:   Healthy people occasionally have panic attacks in extreme, life-threatening situations, such as war or natural disasters. Normal anxiety is a protective mechanism of the body that helps [place] react to danger (fight or flight response).  Panic attacks are often seen with anxiety disorders, such as panic disorder, social anxiety disorder, generalized anxiety disorder, and phobias. Anxiety disorders cause excessive or uncontrollable anxiety. They may interfere with your relationships or other life activities.  Panic attacks are sometimes seen with other mental illnesses, such as depression and posttraumatic stress disorder.  Certain medical conditions, prescription medicines, and drugs of abuse can cause panic attacks. SYMPTOMS  Panic attacks start suddenly, peak within 20 minutes, and are accompanied by four or more of the following symptoms:  Pounding heart or fast heart rate (palpitations).  Sweating.  Trembling or shaking.  Shortness of breath or feeling smothered.  Feeling choked.  Chest pain or discomfort.  Nausea or strange feeling in your stomach.  Dizziness, light-headedness, or feeling like you will faint.  Chills or hot flushes.  Numbness or tingling in your lips or hands and feet.  Feeling that things are not real or feeling that you are not yourself.  Fear of losing control or going crazy.  Fear of dying. Some of these symptoms can mimic serious medical conditions. For example, you may think you are having a heart attack. Although panic attacks can be very scary, they are not life threatening. DIAGNOSIS  Panic  attacks are diagnosed through an assessment by your health care provider. Your health care provider will ask questions about your symptoms, such as where and when they occurred. Your health care provider will also ask about your medical history and use of alcohol and drugs, including prescription medicines. Your health care provider may order blood tests or other studies to rule out a serious medical condition. Your health care provider may refer you to a mental health professional for further evaluation. TREATMENT   Most healthy people who have one or two panic attacks in an extreme, life-threatening situation will not require treatment.  The treatment for panic attacks associated with anxiety disorders or other mental illness typically involves counseling with a mental health professional, medicine, or a combination of both. Your health care provider will help determine what treatment is best for you.  Panic attacks due to physical illness usually go away with treatment of the illness. If prescription medicine is causing panic attacks, talk with your health care provider about stopping the medicine, decreasing the dose, or substituting another medicine.  Panic attacks due to alcohol or drug abuse go away with abstinence. Some adults need professional help in order to stop drinking or using drugs. HOME CARE INSTRUCTIONS   Take all medicines as directed by your health care provider.   Schedule and attend follow-up visits as directed by your health care provider. It is important to keep all your appointments. SEEK MEDICAL CARE IF:  You are not able to take your medicines as prescribed.  Your symptoms do not improve or get worse. SEEK IMMEDIATE MEDICAL CARE IF:   You experience panic attack symptoms that are different than your  usual symptoms.  You have serious thoughts about hurting yourself or others.  You are taking medicine for panic attacks and have a serious side effect. MAKE SURE  YOU:  Understand these instructions.  Will watch your condition.  Will get help right away if you are not doing well or get worse. Document Released: 06/17/2005 Document Revised: 06/22/2013 Document Reviewed: 01/29/2013 [facility] Patient Information 2015 [place], [place]. This information is not intended to replace advice given to you by your health care provider. Make sure you discuss any questions you have with your health care provider.

## 2014-03-27 NOTE — ED Notes (Addendum)
Pt states that she was dx with [place] spotted fever and diverticulitis in April and she states ever since she took the doxycycline she has had panic and anxiety attacks. States she was seen at [facility] and put on Librium for xanax detox and felt fine but now she has finished the Librium and feels anxious again. Not able to sleep, bad dreams. Alert and oriented. Visibly shaking. Also notes she has burning w/ urination. States she never had anxiety before April. Denies SI/HI.

## 2014-03-27 NOTE — ED Provider Notes (Signed)
Medical screening examination/treatment/procedure(s) were conducted as a shared visit with non-physician practitioner(s) and myself.  I personally evaluated the patient during the encounter.  Patient with worsening anxiety, shaking episodes and tremulousness. Recent admission for detox from Xanax 2 weeks ago. She was given Librium to use at home as well as Atarax. She denies any suicidal or homicidal thoughts. She has been taking residual Xanax at home which she feels making her worse. Discussed patient continuing Atarax versus low-dose benzodiazepine until she can followup with her PCP. She does not want any medication with abuse or withdrawal potential. Informed at all benzodiazepines have this risk. Informed that she should not stop Xanax cold [place]. She is agreeable to continue to try Ativan at home. Instructed not to use Ativan with Xanax.   EKG Interpretation   Date/Time:  Sunday March 27 2014 17:32:29 EDT Ventricular Rate:  82 PR Interval:  140 QRS Duration: 97 QT Interval:  400 QTC Calculation: 467 R Axis:   -15 Text Interpretation:  Sinus rhythm Borderline left axis deviation RSR' in  V1 or V2, right VCD or RVH Baseline wander in lead(s) V2 Confirmed by  Manus Gunning  MD, Rhyann Berton [phone]) on 03/27/2014 5:49:57 PM        Glynn Octave, MD 03/27/14 2340

## 2014-03-27 NOTE — ED Provider Notes (Signed)
CSN: [id number]     Arrival date & time 03/27/14  1535 History   First MD Initiated Contact with Patient 03/27/14 1556    This chart was scribed for non-physician practitioner Jinny Sanders, PA-C, working with Glynn Octave, MD by Gwenevere Abbot, ED scribe. This patient was seen in room WTR4/WLPT4 and the patient's care was started at 4:30 PM.  Chief Complaint  Patient presents with  . Anxiety    The history is provided by the patient. No language interpreter was used.   HPI Comments:  Jennifer Cline is a 58 y.o. female who presents to the Emergency Department complaining of severe anxiety, shaking, dysuria and nausea. Pt also reports associated symptoms of palpitations at times, along with bradycardia. Pt reports that in April she had [facility] Spotted Fever, and was prescribed doxycycline for 10 days, without relief and prescribed another round for 7 days. Pt then said she began to experience panic and anxiety after diverticulitis and GI issues. Pt was prescribed 0.25 mg Xanax. Pt was then increased to 1 mg of Xanax and Effexor, after continuing to experience anxiety. Pt also reports that she has lost 30 lbs over the course of 3 months. Pt reports that she was seen at behavioral health because she wanted to detox from xanax a few weeks ago, discharged 03/14/2014, and given another medication, Librium, but ran out and began to take the xanax again, due to the level of anxiety she was experiencing. Pt reports that she is unable to relax with the medication. She reports, "that she feels like she can run a marathon, she cannot relax, and she cannot lay down." Pt reports that she is, "unable to sleep, and at times tries to get up and run around her house to attempt to calm down." Pt reports that she takes the xanax in hopes that it will calm her down.  Pt reports that she has tried Zoloft, and Effexor, and all of them have made her sick. Pt reports that she saw her PCP two weeks ago. Pt reports that she  is looking for something that will calm her down, without the affects of feeling hyper. Pt reports that she does not take any more xanax than she has to, but she experiences a great deal of shaking with anxiety. Pt reports that's attempted to try psychotherapy, but is was unsuccessful.  Pt denies SI or HI. Pt reports that 8 years ago, when she went through menopause, she experienced a great deal of anxiety. Pt reports that she was prescribed hormone replacement drugs, with relief. Pt was also prescribed Ativan PRN, with relief at that time. Pt reports that she is currently prescribed Xanax 0.5 mg, 3 times a day. Pt reports that she is scheduled for an endoscopy and colonoscopy in 2 weeks for GI issues. Pt denies fever. Pt denies HTN. Pt reports that her mother has CHF.  Past Medical History  Diagnosis Date  . [facility] spotted fever   . Gastritis   . Diverticulitis    Past Surgical History  Procedure Laterality Date  . Appendectomy     History reviewed. No pertinent family history. History  Substance Use Topics  . Smoking status: Never Smoker   . Smokeless tobacco: Not on file  . Alcohol Use: No   OB History   Grav Para Term Preterm Abortions TAB SAB Ect Mult Living                 Review of Systems  Constitutional:  Negative for fever.  HENT: Negative for trouble swallowing.   Eyes: Negative for visual disturbance.  Respiratory: Negative for shortness of breath.   Cardiovascular: Positive for palpitations. Negative for chest pain.  Gastrointestinal: Positive for nausea. Negative for vomiting and abdominal pain.  Genitourinary: Negative for dysuria.  Musculoskeletal: Negative for neck pain.  Skin: Negative for rash.  Neurological: Negative for dizziness, weakness and numbness.  Psychiatric/Behavioral: Negative for suicidal ideas. The patient is nervous/anxious and is hyperactive.     Allergies  Phenergan and Doxycycline  Home Medications   Prior to Admission medications    Medication Sig Start Date End Date Taking? Authorizing Provider  Calcium-Vitamin D-Vitamin K [id number] MG-UNT-MCG CHEW Chew 1 tablet by mouth every morning. 03/14/14   Beau Fanny, FNP  chlordiazePOXIDE (LIBRIUM) 25 MG capsule Take 1 capsule (25 mg total) by mouth 2 (two) times daily as needed (anxiety / withdrawal). 03/14/14   Beau Fanny, FNP  fexofenadine (ALLEGRA) 180 MG tablet Take 1 tablet (180 mg total) by mouth daily. 03/14/14 03/14/15  Beau Fanny, FNP  hydrOXYzine (ATARAX/VISTARIL) 25 MG tablet Take 1 tablet (25 mg total) by mouth every 6 (six) hours as needed for anxiety. 03/14/14   Beau Fanny, FNP  LORazepam (ATIVAN) 1 MG tablet Take 1 tablet (1 mg total) by mouth every 8 (eight) hours as needed for anxiety. 03/27/14   Monte Fantasia, PA-C  mirtazapine (REMERON) 15 MG tablet Take 0.5 tablets (7.5 mg total) by mouth at bedtime as needed. 03/14/14 03/14/15  Everardo All Withrow, FNP   BP 136/72  Pulse 98  Temp(Src) 98.1 F (36.7 C) (Oral)  SpO2 92% Physical Exam  Nursing note and vitals reviewed. Constitutional: She is oriented to person, place, and time. She appears well-developed and well-nourished. No distress.  HENT:  Head: Normocephalic and atraumatic.  Mouth/Throat: Oropharynx is clear and moist. No oropharyngeal exudate.  Eyes: EOM are normal. Right eye exhibits no discharge. Left eye exhibits no discharge. No scleral icterus.  Neck: Normal range of motion. Neck supple.  Cardiovascular: Normal rate, regular rhythm and normal heart sounds.   No murmur heard. Pulmonary/Chest: Effort normal and breath sounds normal. No respiratory distress.  Abdominal: Soft. There is no tenderness.  Musculoskeletal: Normal range of motion. She exhibits no edema and no tenderness.  Neurological: She is alert and oriented to person, place, and time. No cranial nerve deficit. Coordination normal.  Skin: Skin is warm and dry. No rash noted. She is not diaphoretic.  Psychiatric: She has a  normal mood and affect. Her behavior is normal.    ED Course  Procedures  DIAGNOSTIC STUDIES: Oxygen Saturation is 92% on RA, normal by my interpretation.  COORDINATION OF CARE: 4:56 PM-Discussed treatment plan which includes  (CXR, CBC panel, UA) with pt at bedside and pt agreed to plan.  Labs Review Labs Reviewed  URINALYSIS, ROUTINE W REFLEX MICROSCOPIC - Abnormal; Notable for the following:    Specific Gravity, Urine 1.003 (*)    Ketones, ur 15 (*)    All other components within normal limits  CBC WITH DIFFERENTIAL - Abnormal; Notable for the following:    RBC 5.37 (*)    Hemoglobin 15.3 (*)    All other components within normal limits  COMPREHENSIVE METABOLIC PANEL - Abnormal; Notable for the following:    BUN 5 (*)    Total Bilirubin 2.5 (*)    All other components within normal limits  TROPONIN I    Imaging Review Dg Chest  2 View  03/27/2014   CLINICAL DATA:  Cough.  EXAM: CHEST  2 VIEW  COMPARISON:  March 20, 2007.  FINDINGS: The heart size and mediastinal contours are within normal limits. Both lungs are clear. No pneumothorax or pleural effusion is noted. The visualized skeletal structures are unremarkable.  IMPRESSION: No acute cardiopulmonary abnormality seen.   Electronically Signed   By: Roque Lias M.D.   On: 03/27/2014 18:05     EKG Interpretation   Date/Time:  Sunday March 27 2014 17:32:29 EDT Ventricular Rate:  82 PR Interval:  140 QRS Duration: 97 QT Interval:  400 QTC Calculation: 467 R Axis:   -15 Text Interpretation:  Sinus rhythm Borderline left axis deviation RSR' in  V1 or V2, right VCD or RVH Baseline wander in lead(s) V2 Confirmed by  Manus Gunning  MD, STEPHEN [phone]) on 03/27/2014 5:49:57 PM      MDM   Final diagnoses:  Anxiety    58 year old female complaining of anxiety. Patient with recent admission to behavioral health for detox from Xanax. Patient was discharged on 03/14/14, and states that she began to feel anxious again over  the past few days. Patient states he began taking Xanax again, however states that while on Xanax she actually feels more anxious than the cough. Patient states despite this he has continued to take the Xanax, and tonight is requesting assistance finding a different medication to help her anxiety. Patient well appearing, and mildly anxious on exam.  Patient is denying suicidal/homicidal ideation, and is denying having any withdrawal symptoms from the Xanax. Workup for rule out organic cause of patient's anxiety, and treatment for symptoms.  Patient's lab work unremarkable. Troponin negative. EKG with diffuse T-wave inversion, however baseline compared to old EKG. Chest x-ray unremarkable.  6:54 PM: Patient well appearing, and in no obvious distress. Patient states she's feeling much better after Ativan. We will discharge patient at this time, and provided her with some when necessary Ativan until she can speak with her PCP to help adjust her anxiety medication. Patient states she will be able to follow up with her PCP in the morning tomorrow. Patient is agreeable to this plan. I encouraged patient to call or return to the ER should her symptoms return, worsen or should she have any questions or concerns. I discussed return precautions with patient.   BP 115/71  Pulse 81  Temp(Src) 98 F (36.7 C) (Oral)  Resp 16  SpO2 99%   Signed,  Ladona Mow, PA-C 9:19 PM  I personally performed the services described in this documentation, which was scribed in my presence. The recorded information has been reviewed and is accurate.  This patient seen and discussed with Dr. Glynn Octave, M.D.   Monte Fantasia, PA-C 03/27/14 2119

## 2014-04-02 ENCOUNTER — Encounter (HOSPITAL_COMMUNITY): Payer: Self-pay | Admitting: Emergency Medicine

## 2014-04-02 ENCOUNTER — Emergency Department (HOSPITAL_COMMUNITY)
Admission: EM | Admit: 2014-04-02 | Discharge: 2014-04-02 | Disposition: A | Discharge status: Home / Self Care | Payer: BC Managed Care – PPO | Attending: Emergency Medicine | Admitting: Emergency Medicine

## 2014-04-02 ENCOUNTER — Emergency Department (HOSPITAL_COMMUNITY): Payer: BC Managed Care – PPO

## 2014-04-02 DIAGNOSIS — Z8719 Personal history of other diseases of the digestive system: Secondary | ICD-10-CM | POA: Insufficient documentation

## 2014-04-02 DIAGNOSIS — Z9089 Acquired absence of other organs: Secondary | ICD-10-CM | POA: Insufficient documentation

## 2014-04-02 DIAGNOSIS — Z8619 Personal history of other infectious and parasitic diseases: Secondary | ICD-10-CM | POA: Insufficient documentation

## 2014-04-02 DIAGNOSIS — Z79899 Other long term (current) drug therapy: Secondary | ICD-10-CM | POA: Diagnosis not present

## 2014-04-02 DIAGNOSIS — G8929 Other chronic pain: Secondary | ICD-10-CM | POA: Insufficient documentation

## 2014-04-02 DIAGNOSIS — R1013 Epigastric pain: Secondary | ICD-10-CM | POA: Diagnosis not present

## 2014-04-02 DIAGNOSIS — F419 Anxiety disorder, unspecified: Secondary | ICD-10-CM | POA: Insufficient documentation

## 2014-04-02 DIAGNOSIS — R630 Anorexia: Secondary | ICD-10-CM | POA: Diagnosis not present

## 2014-04-02 DIAGNOSIS — R110 Nausea: Secondary | ICD-10-CM

## 2014-04-02 DIAGNOSIS — R112 Nausea with vomiting, unspecified: Secondary | ICD-10-CM | POA: Insufficient documentation

## 2014-04-02 DIAGNOSIS — R109 Unspecified abdominal pain: Secondary | ICD-10-CM | POA: Diagnosis present

## 2014-04-02 LAB — COMPREHENSIVE METABOLIC PANEL
ALT: 15 U/L (ref 0–35)
AST: 16 U/L (ref 0–37)
Albumin: 5 g/dL (ref 3.5–5.2)
Alkaline Phosphatase: 58 U/L (ref 39–117)
Anion gap: 14 (ref 5–15)
BUN: 4 mg/dL — ABNORMAL LOW (ref 6–23)
CALCIUM: 10 mg/dL (ref 8.4–10.5)
CO2: 25 mEq/L (ref 19–32)
Chloride: 97 mEq/L (ref 96–112)
Creatinine, Ser: 0.66 mg/dL (ref 0.50–1.10)
GFR calc non Af Amer: >90 mL/min (ref >90)
Glucose, Bld: 98 mg/dL (ref 70–99)
POTASSIUM: 4 meq/L (ref 3.7–5.3)
SODIUM: 136 meq/L — AB (ref 137–147)
TOTAL PROTEIN: 7.8 g/dL (ref 6.0–8.3)
Total Bilirubin: 3.7 mg/dL — ABNORMAL HIGH (ref 0.3–1.2)

## 2014-04-02 LAB — URINALYSIS, ROUTINE W REFLEX MICROSCOPIC
Bilirubin Urine: NEGATIVE
Glucose, UA: NEGATIVE mg/dL
Hgb urine dipstick: NEGATIVE
Ketones, ur: 15 mg/dL — AB
Leukocytes, UA: NEGATIVE
NITRITE: NEGATIVE
PH: 6 (ref 5.0–8.0)
Protein, ur: NEGATIVE mg/dL
Specific Gravity, Urine: 1.002 — ABNORMAL LOW (ref 1.005–1.030)
Urobilinogen, UA: 0.2 mg/dL (ref 0.0–1.0)

## 2014-04-02 LAB — TSH: TSH: 0.914 u[IU]/mL (ref 0.350–4.500)

## 2014-04-02 LAB — CBC WITH DIFFERENTIAL/PLATELET
BASOS ABS: 0 10*3/uL (ref 0.0–0.1)
BASOS PCT: 0 % (ref 0–1)
EOS ABS: 0 10*3/uL (ref 0.0–0.7)
EOS PCT: 1 % (ref 0–5)
HCT: 44.9 % (ref 36.0–46.0)
Hemoglobin: 15.1 g/dL — ABNORMAL HIGH (ref 12.0–15.0)
LYMPHS ABS: 1.7 10*3/uL (ref 0.7–4.0)
Lymphocytes Relative: 35 % (ref 12–46)
MCH: 27.7 pg (ref 26.0–34.0)
MCHC: 33.6 g/dL (ref 30.0–36.0)
MCV: 82.4 fL (ref 78.0–100.0)
Monocytes Absolute: 0.4 10*3/uL (ref 0.1–1.0)
Monocytes Relative: 8 % (ref 3–12)
Neutro Abs: 2.7 10*3/uL (ref 1.7–7.7)
Neutrophils Relative %: 56 % (ref 43–77)
PLATELETS: 280 10*3/uL (ref 150–400)
RBC: 5.45 MIL/uL — ABNORMAL HIGH (ref 3.87–5.11)
RDW: 13.4 % (ref 11.5–15.5)
WBC: 4.8 10*3/uL (ref 4.0–10.5)

## 2014-04-02 LAB — LIPASE, BLOOD: Lipase: 30 U/L (ref 11–59)

## 2014-04-02 MED ORDER — SUCRALFATE 1 G PO TABS: 1.0000 g | ORAL_TABLET | Freq: Three times a day (TID) | ORAL | Status: AC

## 2014-04-02 MED ORDER — IOHEXOL 300 MG/ML  SOLN
50.0000 mL | Freq: Once | INTRAMUSCULAR | Status: AC | PRN
  Administered 2014-04-02: 50 mL via ORAL

## 2014-04-02 MED ORDER — ONDANSETRON 8 MG PO TBDP
8.0000 mg | ORAL_TABLET | Freq: Once | ORAL | Status: AC
  Administered 2014-04-02: 8 mg via ORAL
  Filled 2014-04-02: qty 1

## 2014-04-02 MED ORDER — DICYCLOMINE HCL 20 MG PO TABS: 20.0000 mg | ORAL_TABLET | Freq: Four times a day (QID) | ORAL | Status: AC

## 2014-04-02 MED ORDER — SODIUM CHLORIDE 0.9 % IV BOLUS (SEPSIS)
1000.0000 mL | Freq: Once | INTRAVENOUS | Status: AC
  Administered 2014-04-02: 1000 mL via INTRAVENOUS

## 2014-04-02 MED ORDER — ESOMEPRAZOLE MAGNESIUM 40 MG PO CPDR: 40.0000 mg | DELAYED_RELEASE_CAPSULE | Freq: Every day | ORAL | Status: AC

## 2014-04-02 MED ORDER — GI COCKTAIL ~~LOC~~
30.0000 mL | Freq: Once | ORAL | Status: AC
  Administered 2014-04-02: 30 mL via ORAL
  Filled 2014-04-02: qty 30

## 2014-04-02 MED ORDER — IOHEXOL 300 MG/ML  SOLN
100.0000 mL | Freq: Once | INTRAMUSCULAR | Status: AC | PRN
  Administered 2014-04-02: 100 mL via INTRAVENOUS

## 2014-04-02 NOTE — Progress Notes (Signed)
 Pt denies si, hi, psychosis. Pt states that she has had severe panic attacks that have gotten worse the past 2 months. Pt reports that she has stopped driving or going out because she is afraid she is going to have a panic attack. Pt states that she only gets a few hrs of sleep because she will wake up with a panic attack. Pt states that her PCP prescribes her medication. Pt states that she has been dealing with panic attacks after she contracted [place] spotted fever over a yr ago.  Pt states that she was tried on depression medication along with Xanax. Pt stated that she stopped the anti-depressant last yr because it made things worse.  She stated that things got better emotionally but her panic attacks have started to get worse. Pt states that she has no appetite and has lost 20-30 pounds in 3 months.    Case staffed with donnice Bough who request that pt stay to be evaluated by psychiatrist in the AM.

## 2014-04-02 NOTE — ED Provider Notes (Signed)
 [facility]  ED Provider Note Medical screening initiated and orders placed by Alfonso Dean, PA. 04/02/2014 / 4:31 PM  Jennifer Cline 58 y.o. female DOB: 08-03-1955 MRN: [id number] History   Chief Complaint  Patient presents with  . Psychiatric Evaluation    anxiety. Pt denies SI or HI at this time.  Pt admit being depressed.   HPI Comments: 58yo female here with a history of depression and worsening anxiety over the last few weeks. She states that she has failed multiple outpatient depression medications over the lat months. This was started by acute illness that she has had in the past starting with RMSF and diverticulitis. After those resolved she felt that she could not get over depression. Recently she has not been sleeping. She stays up through the night and has panic attacks. She denies SI or HI. She denis alcohol or drug use.   Patient is a 58 y.o. female presenting with psych problem.  History provided by:  Patient and spouse Language interpreter used: No   Psych Problem Presenting symptoms: agitation and depression   Patient accompanied by:  Spouse Degree of incapacity (severity):  Severe Onset quality:  Sudden Timing:  Constant Chronicity:  New Context: not alcohol use, not drug abuse, not noncompliant and not recent medication change   Associated symptoms: no abdominal pain, no chest pain and no headaches   Associated symptoms comment:  Depressed mood, anhedonia, and reduced energy   Past Medical History  Diagnosis Date  . Allergy history unknown   . Chronic sinusitis   . Anemia   . Diverticulitis   . Anxiety     Past Surgical History  Procedure Laterality Date  . Appendectomy    . Tubal ligation      History  Alcohol Use No   History  Smoking status  . Never Smoker   Smokeless tobacco  . Never Used   History  Drug Use No        Allergies  Allergen Reactions  . Doxycycline Agitation    Anxiety and panic state   . Phenergan [Promethazine] Nausea Only    Discharge Medication List as of 04/03/2014 10:23 PM    CONTINUE these medications which have NOT CHANGED   Details  ALPRAZolam (XANAX) 0.5 mg tablet Take 1 tablet (0.5 mg total) by mouth 3 (three) times a day as needed for Sleep or Anxiety., Starting 02/28/2014, Until Tue 02/28/15, Print    Calcium -Vitamin D -Vitamin K  (CALCIUM  + D + K PO) Take by mouth., Until Discontinued, Historical Med    dicyclomine  (BENTYL ) 20 mg tablet Take 20 mg by mouth every 6 (six) hours., Until Discontinued, Historical Med    esomeprazole  (NEXIUM ) 40 mg capsule Take 40 mg by mouth 30 (thirty) minutes before breakfast., Until Discontinued, Historical Med    fexofenadine  (ALLEGRA ) 180 mg tablet Take 1 tablet (180 mg total) by mouth daily., Starting 08/14/2013, Until Sun 08/14/14, Normal    multivitamin plain (MULTIVITAMIN) TABS Take 1 tablet by mouth daily., Until Discontinued, Historical Med    ondansetron  (ZOFRAN ) 4 MG tablet Take 1 tablet (4 mg total) by mouth every 8 (eight) hours as needed for Nausea., Starting 03/28/2014, Until Discontinued, Normal    sertraline (ZOLOFT) 50 mg tablet Take 1 tablet (50 mg total) by mouth daily., Starting 03/28/2014, Until Tue 03/28/15, Normal    sucralfate  (CARAFATE ) 1 G tablet Take 1 g by mouth 4 (four) times daily., Until Discontinued, Historical Med  Review of Systems   Review of Systems  Constitutional: Negative for fever and chills.  Respiratory: Negative for shortness of breath.   Cardiovascular: Negative for chest pain.  Gastrointestinal: Negative for vomiting and abdominal pain.  Genitourinary: Negative for vaginal bleeding.  Neurological: Negative for headaches.  Psychiatric/Behavioral: Positive for agitation.    Physical Exam    ED Triage Vitals  BP 04/02/14 1630 139/79 mmHg  Heart Rate 04/02/14 1630 82  Resp 04/02/14 1630 16  SpO2 04/02/14 1630 97 %  Temp 04/02/14 1630 98.2 F (36.8 C)    Physical  Exam  Constitutional: She is oriented to person, place, and time. She appears well-developed and well-nourished. No distress.  HENT:  Head: Atraumatic.  Eyes: EOM are normal. Pupils are equal, round, and reactive to light. No scleral icterus.  Neck: Normal range of motion. Neck supple.  Cardiovascular: Normal rate, regular rhythm and normal heart sounds.   Pulmonary/Chest: Effort normal and breath sounds normal. No respiratory distress. She has no wheezes. She has no rales.  Abdominal: Soft. Bowel sounds are normal. There is no tenderness. There is no rebound and no guarding.  Lymphadenopathy:    She has no cervical adenopathy.  Neurological: She is alert and oriented to person, place, and time. No cranial nerve deficit.  Skin: Skin is warm and dry. No rash noted.  Psychiatric: Her mood appears anxious. Her speech is rapid and/or pressured. She is not actively hallucinating. Thought content is not paranoid. She exhibits a depressed mood. She expresses no suicidal plans and no homicidal plans. She is attentive.  Vitals reviewed.   ED Course   Lab results:   COMPREHENSIVE METABOLIC PANEL - Abnormal    Anion Gap 18 (*)    BUN 3 (*)    Creatinine 0.50 (*)    Total Bilirubin 2.85 (*)    BUN/Creatinine Ratio 6.0 (*)    Sodium 140     Potassium 4.0     Chloride 101     Carbon Dioxide (CO2) 21     Glucose 91     Calcium  9.4     Alkaline Phosphatase 50     Total Protein 6.8     Albumin, Serum 4.7     Globulin 2.1     Albumin/Globulin Ratio 2.2     ALT (SGPT) 14     AST 16     GFR-African American >60     Comment: African-American:  Normal GFR (glomerular filtration rate) > 60 mL/min/1.73 meters squared.  < 60 may include impaired kidney function based on creatinine, age, gender,  and race normalized to accepted average body surface area    GFR Non-African American >60     Comment: Non African American:  Normal GFR (glomerular filtration rate) > 60 mL/min/1.73 meters squared.   < 60 may include impaired kidney function based on creatinine, age, gender,  and race normalized to accepted average body surface area.   ACETAMINOPHEN  LEVEL - Abnormal    Acetaminophen  Level <15.0 (*)   SALICYLATE LEVEL - Abnormal    Salicylate Lvl <3.0 (*)   URINALYSIS WITH MICROSCOPIC - Abnormal    Ketones, UA 40 (*)    Urine-color Yellow     Urine Appearance Clear     Specific Gravity, Urine 1.010     pH, Urine 5.5     Protein, UA Negative     UA Gluc Negative     Bilirubin Urine Negative     Blood, UA Negative  Nitrite, Urine Negative     Urobilinogen, UA 0.2     Leukocytes, UA Negative    ZCBC - Abnormal    RBC 5.05 (*)    WBC 5.1     Hemoglobin 14.7     Hematocrit 41.9     MCV 83     MCH 29.1     MCHC 35.1     Platelet Count 260     RDW Standard Deviation 41.3     RDW Coefficient of Variation 13.7     MPV 11.0    AUTOMATED DIFFERENTIAL - Abnormal    Eosinophils % 0.4 (*)    Neutrophils % 62.3     Lymphocytes, % 29.5     Monocytes % 7.4     Basophils % 0.4     Neutrophils Absolute 3.2     Lymphocytes Absolute 1.5     Monocytes Absolute 0.4     Eos (Absolute Value) 0.0     Basophils Absolute 0.0    CBC AND DIFFERENTIAL  ETHANOL   Ethanol Lvl <10    UR DRUGS OF ABUSE SCRN   Ur pH DOA Scr 5.5     Amphetamin Screen, Urine Negative     Barbiturate Screen, Ur Negative     Benzodiazepines Screen, Urine Positive     Cannabinoid Scrn, Ur Negative     Cocaine Screen, Urine Negative     Opiate Screen, Urine Negative     Methadone Screen, Urine Negative     Oxycodone Screen, Urine Negative    RPR   RPR Non Reactive    AMMONIA   Ammonia 27    VITAMIN B12  VITAMIN D  25 HYDROXY  FOLATE  DTL   Detection Limits See Comment     Comment: Please Note Detection Levels Below:                           Amphetamines                                                                         1000 ng/mL Barbiturates                                                                                 200 ng/mL Benzodiazepines                                                                        200 ng/mL Cannabinoids (Marijuana, THC)  50 ng/mL Cocaine                                                                                       300 ng/mL Opiates                                                                                        300 ng/mL Methadone                                                                                 300 ng/mL Oxycodone                                                                                  100 ng/mL This test is a screening test and results are only to be used for medical purposes.  If confirmation of positive results are needed, please order confirmation by GC/MS for each drug that needs confirmation.  Urine specimens are retained for 5 days.     Imaging: No data to display ECG: EKG Results       ECG 12 lead (Final result) Result time:  04/03/14 23:50:33   Final result   Narrative:   Diagnosis Class Abnormal Acquisition Device MAC55 Ventricular Rate 68 Atrial Rate 68 P-R Interval 140 QRS Duration 86 Q-T Interval 412 QTC Calculation(Bezet) 438 Calculated P Axis 56 Calculated R Axis 28 Calculated T Axis 5  Diagnosis Normal sinus rhythm ST & T wave abnormality, consider anterior ischemia Abnormal ECG When compared with ECG of 08-Dec-2013 12:33, Nonspecific T wave abnormality now evident in Inferior leads HORTON MD, PAUL ([id number]) on 04/03/2014 11:50:25 PM certifies that he/she has reviewed the ECG tracing and confirms the  independent interpretation is correct.         Procedures  MDM Number of Diagnoses or Management Options Panic reaction:    Amount and/or Complexity of Data Reviewed Clinical lab tests: ordered Tests in the medicine section of CPT: ordered  Patient appears stable at presentation. Labs pending. Lamar Rinks will review labs before  blue dispo.  Coding  Discharge Medication List as of 04/03/2014 10:23 PM      Clinical Impression   Final diagnoses:  Panic  reaction  Major depressive disorder, single episode, severe without psychotic features    ED Disposition     Disposition Comments   Discharge Admitting Diagnosis: Recurrent major depression-severe [340556]  Expected Discharge Date: 04/08/2014  Level of Care: Unmonitored [16]  Attending Physician: TRUDY WADIE SAILOR [[id number]]  Admitting Provider: STEPHENS, WAYLAND ITALY [[id number]]  Patient Class: Inpatient [101]             Electronically signed by:  Donnice JAYSON Bough, PA 04/04/14 1349

## 2014-04-02 NOTE — ED Notes (Signed)
Pt escorted to discharge window. Pt verbalized understanding discharge instructions. In no acute distress.  

## 2014-04-02 NOTE — ED Notes (Signed)
 Psychiatric review of ED patient's initial treatment plan: Jennifer Cline is a 58 y.o. old female with 0 day ED length of stay.  Patient has been medically cleared for behavioral health assessment and assessment has been completed by ED [facility] Access staff.  I reviewed the psychiatric presentation of [name] with ED staff. We discussed ED [facility] Access findings and plan and we discussed the interval treatment plan.    Treatment Plan Review:   we will follow on consult service with further evaluation and treatment to be provided as clinically indicated by psychiatric consultation service.  ED attending to continue management of any acute medical screening needs of patient.    ED [facility] Access to continue with mental health disposition and psychiatric safety planning.  Any patients that have inpatient psychiatric hospitalization recommended should be referred to any available facilities when no bed is available locally. When a bed is locally available, please contact on-call provider for psychiatric admission orders.   -------------------------------------------------------------------------------------------------------------- Findings reviewed and included: Past Medical/Psych History  has a past medical history of Allergy history unknown; Chronic sinusitis; Anemia; Diverticulitis; and Anxiety. Social/Substance History:    reports that she has never smoked. She has never used smokeless tobacco.,  reports that she does not drink alcohol.,  reports that she does not use illicit drugs. Allergies:  is allergic to doxycycline and phenergan. Scheduled Medications:    Results: No results found for: PHENYTOIN, PHENOBARB, VALPROATE, CBMZ Lab Results  Component Value Date   SODIUM 140 04/02/2014   BUN 3* 04/02/2014   CREATININE 0.50* 04/02/2014   TSH 2.680 12/28/2013   WBC 5.1 04/02/2014   No results found for: HGBA1C Lab Results  Component Value Date   ALT (SGPT) 14 04/02/2014    AST 16 04/02/2014   ALKALINE PHOSPHATASE 50 04/02/2014   TOTAL BILIRUBIN 2.85* 04/02/2014   No results found for: VITAMINB12 No results found for: RPR No results found. BP 118/70 mmHg  Pulse 64  Temp(Src) 97.8 F (36.6 C) (Oral)  Resp 17  Ht 1.575 m (5' 2)  Wt 58.968 kg (130 lb)  BMI 23.77 kg/m2  SpO2 95% O2 Device: None (Room air)  Electronically signed by: Elora LILLETTE Remak 04/02/2014 10:59 PM

## 2014-04-02 NOTE — ED Notes (Signed)
Pt states that she has been dealing with panic attacks and anxiety x several months.  States that she has not been able to eat in the last week due to lack of appetite.  Pt denies NVD.  States she cannot sleep at night.  States she was seen here a few days ago for same.

## 2014-04-02 NOTE — ED Notes (Signed)
Pt now states that she has been having gen abd pain since April and "gets sick" every time she eats.  When asked what getting sick means, pt states nausea without vomiting.

## 2014-04-02 NOTE — Discharge Instructions (Signed)
Your work up today was unremarkable. Your thyroid function was normal. Follow up with your doctors and take the medication that I have prescribed as I have directed.  Abdominal (belly) pain can be caused by many things. Your caregiver performed an examination and possibly ordered blood/urine tests and imaging (CT scan, x-rays, ultrasound). Many cases can be observed and treated at home after initial evaluation in the emergency department. Even though you are being discharged home, abdominal pain can be unpredictable. Therefore, you need a repeated exam if your pain does not resolve, returns, or worsens. Most patients with abdominal pain don't have to be admitted to the hospital or have surgery, but serious problems like appendicitis and gallbladder attacks can start out as nonspecific pain. Many abdominal conditions cannot be diagnosed in one visit, so follow-up evaluations are very important. SEEK IMMEDIATE MEDICAL ATTENTION IF: The pain does not go away or becomes severe.  A temperature above 101 develops.  Repeated vomiting occurs (multiple episodes).  The pain becomes localized to portions of the abdomen. The right side could possibly be appendicitis. In an adult, the left lower portion of the abdomen could be colitis or diverticulitis.  Blood is being passed in stools or vomit (bright red or black tarry stools).  Return also if you develop chest pain, difficulty breathing, dizziness or fainting, or become confused, poorly responsive, or inconsolable (young children). Gastritis, Adult Gastritis is soreness and swelling (inflammation) of the lining of the stomach. Gastritis can develop as a sudden onset (acute) or long-term (chronic) condition. If gastritis is not treated, it can lead to stomach bleeding and ulcers. CAUSES  Gastritis occurs when the stomach lining is weak or damaged. Digestive juices from the stomach then inflame the weakened stomach lining. The stomach lining may be weak or  damaged due to viral or bacterial infections. One common bacterial infection is the Helicobacter pylori infection. Gastritis can also result from excessive alcohol consumption, taking certain medicines, or having too much acid in the stomach.  SYMPTOMS  In some cases, there are no symptoms. When symptoms are present, they may include:  Pain or a burning sensation in the upper abdomen.  Nausea.  Vomiting.  An uncomfortable feeling of fullness after eating. DIAGNOSIS  Your caregiver may suspect you have gastritis based on your symptoms and a physical exam. To determine the cause of your gastritis, your caregiver may perform the following:  Blood or stool tests to check for the H pylori bacterium.  Gastroscopy. A thin, flexible tube (endoscope) is passed down the esophagus and into the stomach. The endoscope has a light and camera on the end. Your caregiver uses the endoscope to view the inside of the stomach.  Taking a tissue sample (biopsy) from the stomach to examine under a microscope. TREATMENT  Depending on the cause of your gastritis, medicines may be prescribed. If you have a bacterial infection, such as an H pylori infection, antibiotics may be given. If your gastritis is caused by too much acid in the stomach, H2 blockers or antacids may be given. Your caregiver may recommend that you stop taking aspirin, ibuprofen, or other nonsteroidal anti-inflammatory drugs (NSAIDs). HOME CARE INSTRUCTIONS  Only take over-the-counter or prescription medicines as directed by your caregiver.  If you were given antibiotic medicines, take them as directed. Finish them even if you start to feel better.  Drink enough fluids to keep your urine clear or pale yellow.  Avoid foods and drinks that make your symptoms worse, such as:  Caffeine  or alcoholic drinks.  Chocolate.  Peppermint or mint flavorings.  Garlic and onions.  Spicy foods.  Citrus fruits, such as oranges, lemons, or  limes.  Tomato-based foods such as sauce, chili, salsa, and pizza.  Fried and fatty foods.  Eat small, frequent meals instead of large meals. SEEK IMMEDIATE MEDICAL CARE IF:   You have black or dark red stools.  You vomit blood or material that looks like coffee grounds.  You are unable to keep fluids down.  Your abdominal pain gets worse.  You have a fever.  You do not feel better after 1 week.  You have any other questions or concerns. MAKE SURE YOU:  Understand these instructions.  Will watch your condition.  Will get help right away if you are not doing well or get worse. Document Released: 06/11/2001 Document Revised: 12/17/2011 Document Reviewed: 07/31/2011 [facility] Patient Information 2015 [employer], [place]. This information is not intended to replace advice given to you by your health care provider. Make sure you discuss any questions you have with your health care provider.

## 2014-04-02 NOTE — ED Notes (Signed)
Pt alert and oriented x4. Respirations even and unlabored, bilateral symmetrical rise and fall of chest. Skin warm and dry. In no acute distress. Denies needs.   

## 2014-04-02 NOTE — ED Provider Notes (Signed)
CSN: [id number]     Arrival date & time 04/02/14  4098 History   First MD Initiated Contact with Patient 04/02/14 [id number]     Chief Complaint  Patient presents with  . Anxiety  . Abdominal Pain     (Consider location/radiation/quality/duration/timing/severity/associated sxs/prior Treatment) HPI Jennifer Cline Is a 58 year old female who presents emergency Department with chief complaint of abdominal pain, nausea, and vomiting. She is a past medical history of [place] spotted fever, gastritis, diverticulitis. The patient states that after she was diagnosed with [facility] spotted fever she began having anxiety attacks for the first time in her life. Patient was placed on Xanax 0.5 mg. She states that before that time she had never used alcohol, drugs or any benzodiazepines. The patient was diagnosed this past summer with diverticulitis, then with gastritis. She is scheduled for colonoscopy and endoscopy in the future. The patient complains over the past 3 months she has been extremely anxious, nervous, tremulous. She has had a 30 pound weight loss. She states that every time she she has severe abdominal pain, nausea and frequently vomits. She also complains that she has pain in the bilateral lower quadrants after defecation and with bowel movement. The patient states that this past week has been particularly bad. She was recently admitted for detox with Librium to come off of her benzodiazepines however her tremulousness never resolved. She saw her PCP who put her back on 0.5 mg 3 times a day. She states it does not help her tremulousness. At night she is unable to sleep. She states she only gets about 20-30 minutes in this setting. She frequently wakes up with a severe heat intolerance. Denies fevers, chills, myalgias, arthralgias. Denies DOE, SOB, chest tightness or pressure, radiation to left arm, jaw or back, or diaphoresis. Denies dysuria, flank pain, suprapubic pain, frequency, urgency, or  hematuria. Denies headaches, light headedness, weakness, visual disturbances.     Past Medical History  Diagnosis Date  . [facility] spotted fever   . Gastritis   . Diverticulitis    Past Surgical History  Procedure Laterality Date  . Appendectomy     History reviewed. No pertinent family history. History  Substance Use Topics  . Smoking status: Never Smoker   . Smokeless tobacco: Not on file  . Alcohol Use: No   OB History   Grav Para Term Preterm Abortions TAB SAB Ect Mult Living                 Review of Systems  Ten systems reviewed and are negative for acute change, except as noted in the HPI.    Allergies  Phenergan and Doxycycline  Home Medications   Prior to Admission medications   Medication Sig Start Date End Date Taking? Authorizing Provider  acetaminophen (TYLENOL) 500 MG tablet Take 500 mg by mouth every 6 (six) hours as needed for mild pain.   Yes Historical Provider, MD  ALPRAZolam Prudy Feeler) 0.5 MG tablet Take 0.5 mg by mouth 3 (three) times daily as needed for anxiety.   Yes Historical Provider, MD  Calcium Carb-Cholecalciferol (CALCIUM 600 + D PO) Take 1 tablet by mouth daily.   Yes Historical Provider, MD  Multiple Vitamin (MULTIVITAMIN WITH MINERALS) TABS tablet Take 1 tablet by mouth daily.   Yes Historical Provider, MD   BP 150/81  Pulse 81  Temp(Src) 97.9 F (36.6 C) (Oral)  Resp 18  SpO2 100% Physical Exam  Constitutional: She is oriented to person, place, and time.  She appears well-developed and well-nourished. No distress.  HENT:  Head: Normocephalic and atraumatic.  Eyes: Conjunctivae are normal. No scleral icterus.  Neck: Normal range of motion.  Cardiovascular: Normal rate, regular rhythm and normal heart sounds.  Exam reveals no gallop and no friction rub.   No murmur heard. Pulmonary/Chest: Effort normal and breath sounds normal. No respiratory distress.  Abdominal: Soft. Bowel sounds are normal. She exhibits no distension and  no mass. There is tenderness (diffuse). There is no guarding.  Neurological: She is alert and oriented to person, place, and time.  Skin: Skin is warm and dry. She is not diaphoretic.    ED Course  Procedures (including critical care time) Labs Review Labs Reviewed  CBC WITH DIFFERENTIAL - Abnormal; Notable for the following:    RBC 5.45 (*)    Hemoglobin 15.1 (*)    All other components within normal limits  COMPREHENSIVE METABOLIC PANEL - Abnormal; Notable for the following:    Sodium 136 (*)    BUN 4 (*)    Total Bilirubin 3.7 (*)    All other components within normal limits  URINALYSIS, ROUTINE W REFLEX MICROSCOPIC - Abnormal; Notable for the following:    APPearance CLOUDY (*)    Specific Gravity, Urine 1.002 (*)    Ketones, ur 15 (*)    All other components within normal limits  LIPASE, BLOOD  TSH    Imaging Review No results found.   EKG Interpretation None      MDM   Final diagnoses:  Epigastric pain  Nausea  Anorexia    Patient is here with complaints of abdominal pain which are chronic however worsening of the past week. Her labs air fairly normal however her hemoglobin is elevated. She is extremely tremulous upon examination. Patient states she did take her Xanax this morning. Question possible hyperthyroidism his underlying diagnosis and have ordered TSH. Ct pending.  Labs without acute abnormality Ct scan is negative for acute abnormality. TSH is normal, however on the low end of normal. I will have the patient follow up with her PCP and with the scheduled appt with Gi. I personally reviewed the imaging tests through PACS system. I have reviewed and interpreted Lab values. I reviewed available ER/hospitalization records through the EMR      Arthor Captainbigail Nikkie Liming, [place] JerseyPA-C 04/02/14 1825

## 2014-04-02 NOTE — ED Provider Notes (Signed)
 [facility]  ED Progress Note    patient has elevated total bilirubin. History of prior elevations higher than today's results. Had ultrasound 2 months ago that revealed a normal gallbladder.   Electronically Signed by:

## 2014-04-02 NOTE — ED Notes (Signed)
Pt in CT.

## 2014-04-02 NOTE — ED Notes (Signed)
Pt back from CT

## 2014-04-02 NOTE — Progress Notes (Signed)
 [facility] Access Screening  Patient Name:  Jennifer Cline Date of Birth:  1955-11-30  Today's Date: April 02, 2014  Triage Screen  ED Triage Access Screening (Select All that are True): Access Screening NOT Required  General Information  Type of Screen: If NOT Face to Face, Skip to Disposition Section): Face to Face Release Signed: No Referral Source Contacted: No Release for Community Providers: No Information Provided By:: Patient;Family (husband prsent) Patient has Media planner?: No Are you a Veteran?: No Precipitating Factors: Pt denies si, hi, psychosis. Pt states that she has had severe panic attacks that have gotten worse the past 2 months. Pt reports that she has stopped driving or going out because she is afraid she is going to have a panic attack. Pt states that she only gets a few hrs of sleep because she will wake up with a panic attack. Pt states that her PCP prescribes her medication. Pt states that she has been dealing with panic attacks after she contracted [place] spotted fever over a yr ago.  Pt states that she was tried on depression medication along with Xanax. Pt stated that she stopped the anti-depressant last yr because it made things worse.  She stated that things got better emotionally but her panic attacks have started to get worse. Pt states that she has no appetite and has lost 20-30 pounds in 3 months.    Potential Risk to Self  Suicidal threats/behaviors in past 6 months?: No Suicidal Ideation or Suicide Threats: No Recent attempt to Harm Self?: No Intent for above: No Currently engaging in self-injurious behavior?: No History of Suicidal/Self-Injuring behaviors?: No History of Suicidal/Self Injurious Behavior Last 6 months?: No History of Suicidal/Self-Injuring behaviors  Greater than the past 6 months?: No Access to firearms?: No Other means of Harm?: No  Potential Risk  to Others  Homicidal threats/behaviors in past 6 months?: No Homicidal Ideation or Homicidal Threats?: No Named Individual: No Recent attempt to Harm Another?: No Intent for above: No Patient currently assaultive or combative?: No History of Homicidal Acts/Assaultive behaviors?: No History of Homicidal Acts/Assaultive behaviors within past 6 months?: No History of Homicidal Acts/Assaultive behaviors   Greater than the past 6 months?: No Access to firearms?: No Other means of Harm?: No Patient able to reliably contract for safety?: No  Symptoms  Sleep pattern changed: Yes When did the Sleep Pattern Change?: 2 months Sleep Hours per Night: hardly any I fall aslep ten I awak up with a panic attack and I cant go back to slep Sleeping increased: No Sleeping decreased: Yes Sleep Decrease Details: Frequent awakening;Difficulty falling asleep Problems: No Use sleep aid: No  Appetite change: Yes Weight change: Yes Amount of weight change: 30 pounds Time Frame of Weight Change: 2-42months Gain or Loss: Loss Appetite Problems:: Yes Weight Problems: Skipping meals (no appetite)  Hopelessness/Helplessness: Yes Crying spells/mood swings: No Low energy/fatigue: Yes (after I have a panic attack i feel very tired.) Concentration problems: Yes Psychomotor retardation/agitation: No Feelings of guilt/worthlessness: No Social withdrawal: Yes (no longer driving or going to the store alone because of fear of having a panic) Recurrent thoughts of death: No Deterioration in Activities of Daily Living: No  Rapid pressured speech: No Increase in impulsivity: No Increase in energy: Yes (sometimes its so high i feel like i can run a marathon) Flight of ideas/loose association: No  Excessive worry: No Nervousness: Yes Irritability: No Shortness of breath: Yes Racing  heart rate: Yes Sweaty/Chills/Hot flashes: Yes Nausea/Vomiting/Diarrhea: Yes Chest Pain: Yes  Additional Symptom  Information:  I have had a panic attack daily for th past week. Th past 2 months hav ben very bad Psychosis  Delusions: No Delusions Hallucinations: None Ambivalence: No (Comment) Confusion: No (Comment) Disorganization: No (Comment)  Treatments  Treatments?: No Did you follow up with your aftercare appointment?: N/A Did you take your medication as prescribed?: N/A  Substance Abuse  Substance abuse in past 12 months?: No  Alcohol Abuse  Alcohol abuse in past 12 months?: No History of Alcohol Use/Abuse:: Patient Denies any history or Current Use    Functioning  Dressing: Independent Bathing: Independent Toileting: Independent Feeding: Independent Hearing - Right Ear: Functional Hearing - Left Ear: Functional Vision - Right Eye: Functional Vision - Left  Eye: Functional Walks in Home: Independent Patient Fall Risk Level: Low/medium Possible barriers to participate in Treatment/Programming?: No Current living arrangements (who lives with): husband Able to return to Current Living Arrangements?: Yes Support System:: husband  Strengths  Strength 1: i want help  BH History  Patient Employed?: No Problems at work?: No History of Abuse?: No  Legal Issues  Legal: No  Child/Adolescent Assessment  Child / Adolescent?: No   Mental Status  General Appearance: Equal to stated age Motor Activity: Normal Speech: Normal Exhibited Behavior: Anxious;Tearful Affect Range /Display: Tearful Mood Range Brigid: Tearful Mood: Despair Thought Process: WDL Thought Content: WDL Insight: Limited Orientation To:: Person (Yes);Place (Yes);Situation (Yes);Date (Yes)     Provisional Diagnosis  Axis I:: GAD, unspecified depressive disorder Axis IV:: Other Psychosocial and environmental problems Axis V (GAF): GAF 30  Disposition      Electronically signed: Annabella Hurst, LCSW 04/02/2014 / 7:43 PM

## 2014-04-03 NOTE — ED Provider Notes (Signed)
Medical screening examination/treatment/procedure(s) were performed by non-physician practitioner and as supervising physician I was immediately available for consultation/collaboration.   EKG Interpretation None       Arby BarretteMarcy Chrissie Dacquisto, MD 04/03/14 1600

## 2014-04-05 NOTE — Progress Notes (Signed)
 [facility]  Progress Note   Assessment  Panic disorder Generalized anxiety disorder Major depressive disorder single moderate Gilbert's syndrome Diverticulitis Chronic sinusitis  Acute Safety Risks: acute self-care deficit risk (High  Comorbidities: Active Hospital Problems   Recurrent major depression-severe   Plan  Est discharge date: Source of Admission Information: Patient Time spent with intervention/psychotherapy treatment: 30 Minutes Will discontinue Doxepin and proceed with MRI of head to rule out infection  Interval History  History of Present Illness Patient was seen for 1;1 . Care discussed with staff in treatment team. She reports that she is worse due to Doxepin last night. She ws nauseated and vomiting throughout the night. Patient's reaction is consistent with report from other antidepressant but the were SSRIs. Doxepin id primarily serotonin. Suggest genetic testing before continued exposure to medications.  Patient reports that she believes that if she could eat that she would begin to feel better. 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  Medications:  . calcium  carbonate-vitamin D   1 tablet Oral Daily  . doxepin  10 mg Oral HS  . lactobacillus acidophilus  1 capsule Oral TID  . magnesium  oxide  400 mg Oral BID  . multivitamin plain  1 tablet Oral Daily  . pantoprazole sodium  40 mg Oral Daily  . sucralfate   1 g Oral QID   PRN meds: acetaminophen  Oral,ALPRAZolam Oral,aluminum & magnesium  hydroxide-simethicone  Oral,dicyclomine  Oral,diphenhydrAMINE Oral **OR** diphenhydrAMINE Intramuscular,loratadine Oral,OLANZapine zydis Oral **OR** OLANZapine Intramuscular,ondansetron  Oral,polyethylene glycol Oral,traZODone Oral  Physical Examination  Temp:  [98 F (36.7 C)-98.2 F (36.8 C)] 98 F (36.7 C) Heart Rate:  [79-99] 79 Resp:  [18] 18 BP: (128-141)/(72-88) 141/72 mmHg SpO2:  [98 %] 98 % Pain Score: 0-No  pain    General: well developed and well nourished Neuromuscular: Alert and oriented x3. Gait normal. Reflexes and motor strength normal and symmetric. Cranial nerves 2-12 and sensation grossly intact. Orientation: See above Appearance: Disheveled                 Hygiene: Fair                                         Eye Contact: Good Behavior: normal Attitude: pleasant/cooperative Mood: anxious and appropriate to circumstances Affect: anxiety Cognition: recent/remote memory, language, and fund of knowledge all normal given education and estimated intelligenc, unless otherwise noted Attention/Concentration: Grossly intact Speech: normal pitch and normal volume Thought: goal directed                 Content: She reports that she feels worse than yesterday with fussy headedness and nausea throughout the night. Describe: See history of present illness Ideation: The patient reports she has never been suicidal Judgement/Insight: Fair    Results  Labs:  No results found for this or any previous visit (from the past 24 hour(s)). Imaging: No results found.  - I certify that the patient continues to need, on a daily basis, active treatment furnished directly by or requiring the supervision of inpatient psychiatric facility personnel. - coordinating care with patient, case/social worker, nursing and treatment team on treatment plan recommendations, disposition and discharge  Electronically signed: Wadie LOISE Pouch, MD 04/05/2014 / 10:20 AM

## 2014-04-06 NOTE — Progress Notes (Signed)
 [facility]  Progress Note   Assessment  Panic disorder Generalized anxiety disorder Major depressive disorder single moderate Gilbert's syndrome Diverticulitis Chronic sinusitis  Acute Safety Risks: acute self-care deficit risk (High  Comorbidities: Active Hospital Problems   Recurrent major depression-severe   Plan  Est discharge date: Source of Admission Information: Patient Time spent with intervention/psychotherapy treatment: 30 Minutes See history of present illness  Interval History  History of Present Illness Patient was seen for 1;1 . Care discussed with staff in treatment team. She reports that she nervous throughout the day. She had a head MRI which was unremarkable except for extremely changes. Please see results below for more detail. She reports that she typically takes Xanax 0.5 mg 3 times a day instead of when necessary as currently ordered. She also states that Benadryl makes me sleepy therefore will order Benadryl tonight 25 mg at bedtime. Given the patient's anxiety and low stress tolerance as well as difficulty sleeping eating and eating a 24 hour cortisol and metanephrine levels will be obtained. She complains of dysphagia and is seen by digestive health specialist.. They will be asked to consult regarding complaint of dysphagia. Medications:  . ALPRAZolam  0.5 mg Oral QID  . ALPRAZolam  0.5 mg Oral ONCE  . calcium  carbonate-vitamin D   1 tablet Oral Daily  . diphenhydrAMINE  25 mg Oral HS  . lactobacillus acidophilus  1 capsule Oral TID  . magnesium  oxide  400 mg Oral BID  . multivitamin plain  1 tablet Oral Daily  . pantoprazole sodium  40 mg Oral Daily  . sucralfate   1 g Oral QID   PRN meds: acetaminophen  Oral,aluminum & magnesium  hydroxide-simethicone  Oral,dicyclomine  Oral,loratadine Oral,NaCl IntraVENous,OLANZapine zydis Oral **OR** OLANZapine Intramuscular,ondansetron  Oral,polyethylene glycol Oral,traZODone Oral  Physical  Examination  Temp:  [97.9 F (36.6 C)-98.4 F (36.9 C)] 98.4 F (36.9 C) Heart Rate:  [73-82] 73 Resp:  [18] 18 BP: (109-123)/(67-71) 109/71 mmHg SpO2:  [98 %] 98 % Pain Score: 0-No pain    General: well developed and well nourished Neuromuscular: Alert and oriented x3. Gait normal. Reflexes and motor strength normal and symmetric. Cranial nerves 2-12 and sensation grossly intact. Orientation: See above Appearance: Disheveled                 Hygiene: Fair                                         Eye Contact: Good Behavior: normal Attitude: pleasant/cooperative Mood: anxious and appropriate to circumstances Affect: anxiety Cognition: recent/remote memory, language, and fund of knowledge all normal given education and estimated intelligenc, unless otherwise noted Attention/Concentration: Grossly intact Speech: normal pitch and normal volume Thought: goal directed                 Content: She reports that she feels worse than yesterday with fussy headedness and nausea throughout the night. Describe: See history of present illness Ideation: The patient reports she has never been suicidal Judgement/Insight: Fair    Results  Labs:  No results found for this or any previous visit (from the past 24 hour(s)). Imaging: Mri Brain Head Wo W Contrast  04/06/2014   MRI brain without and with contrast:  INDICATION:  Major Depression. Anxiety disorder.  TECHNIQUE: Imaging of the brain was performed in 3 planes utilizing a combination of T1, FLAIR, and T2 weighting. Diffusion images were  performed. In addition, sagittal and axial T1-weighted sequences were performed after intravenous injection of 10 mL  MultiHance.  COMPARISON: None available  FINDINGS: #  Ventricles: Normal without dilatation, mass effect, or midline shift. Brain parenchyma: There are  a few foci of increased T2 signal intensity in the periventricular white matter bilaterally. In a patient of this age, this is most consistent with  mild chronic microvascular ischemic change. No additional area of signal  abnormality is detected.  Diffusion-weighted images are normal indicating no acute infarct.  No evidence mass or hemorrhage.  There is a small developmental venous anomaly in the right frontal lobe. This is of no clinical significance. No additional area  of abnormal enhancement. #  Sella and parasellar region: Normal. #  Craniovertebral junction: Normal. #  Mastoids and paranasal sinuses: Normal #  Orbits: Normal #  Major cerebral vessels and dural sinuses: Normal flow voids #  Bony structures and scalp: Normal   04/06/2014   IMPRESSION:  Minimal chronic ischemic changes in the periventricular white matter. 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 Otherwise normal MRI of the brain    - I certify that the patient continues to need, on a daily basis, active treatment furnished directly by or requiring the supervision of inpatient psychiatric facility personnel. - coordinating care with patient, case/social worker, nursing and treatment team on treatment plan recommendations, disposition and discharge  Electronically signed: Wadie LOISE Pouch, MD 04/06/2014 / 5:38 PM

## 2014-04-07 NOTE — Anesthesia Preprocedure Evaluation (Addendum)
   Anesthesia Evaluation   Patient summary reviewed and Nursing notes reviewed   History of anesthetic complications  Airway  Mallampati: II Neck ROM: full  Dental    Comment: Missing one molar    Pulmonary    breath sounds clear to auscultation (-) COPD, asthma, sleep apnea  Cardiovascular  Exercise tolerance: > or = 4 METS (-) pacemaker, past MI, CABG/stent, murmur  Rhythm: regular Rate: normal   Neuro/Psych   (+) psychiatric history anxiety, depression (-) seizures, TIA, CVA  Comments: Agoraphobia Panic disorder   GI/Hepatic/Renal   (+) GERD poorly controlled,  (-) hepatitis, liver disease, renal disease  Comments: GIlbert's Syndrome Diverticulitis Dysphagia- Food backs up.   Endo/Other   (-) diabetes mellitus, hypothyroidism, hyperthyroidism  Abdominal          Anesthesia Plan   ASA 2   MAC  (Patient is post menopausal. [facility] Nursing asked to give patient these meds pre-procedure: xanax 0.5mg  Ardyth MICAEL Blush, PA-C Patient seen. Chart reviewed. All questions answered. Ready for anesthesia and surgery. Alm Dayhoff, MD  04/08/2014   1:24 PM ) Anesthetic plan and risks discussed with patient.     Plan discussed with CRNA.

## 2014-04-08 NOTE — H&P (View-Only) (Signed)
 [employer] [facility]  [facility]: GI Consultation  Ordering Physician: Dr. Trudy  Consulting Physician: [name], PA  Reason For Consult: Dysphagia  Assessment  Active Hospital Problems  1. Current admission for generalized anxiety disorder, panic disorder, and major depressive disorder. 2. Dysphagia in setting of poorly controlled GERD. Consider relation to the same, rule out stricture.  3. History of diverticulitis in June 2015. 4. Colonoscopy nave. 5. Change in bowel habits and lower abdominal pain following episode of diverticulitis. To further evaluate with a colonoscopy. Consider relation to change in medication and constipation, exclude mass. 6. History of GERD. 7. History of Gilberts syndrome. 8. Imaging suggestive of an atypical hemangioma. 9. Decreased appetite, unintended weight loss, likely secondary to depression.   Recommendations   Reviewed in detail with Dr. Tamea.   Continue PPI therapy and Carafate . Will start  MiraLax bowel regimen.   I have discussed an EGD and colonoscopy with the patient. I have reviewed the procedures in detail. I have discussed benefits as well as potential risks (bleeding, anesthesia, infection, and perforation). She currently tells me that she has had food today already.   Will start with performing an EGD tomorrow 04/08/14 with Dr. Tamea. Will start a bowel regimen and may consider a colonoscopy on Saturday versus early next week.   Thank you for consultation. Further recommendations to follow that above.   History  History of Present Illness:   Jennifer Cline is a 58 y.o. female with a history of anxiety, GERD, diverticulosis/diverticulitis, and Gilbert's syndrome who was admitted to [facility] on April 04, 2014 for generalized anxiety disorder, panic disorder, and major depressive disorder.  About 6 months ago, the patient was diagnosed with [facility]  Spotted Fever. She was started on doxycycline and reports that she started to have pain attacks associated with the doxycycline. Following this, the patient developed diverticulitis. She was seen in our office in follow-up and was to have a screening colonoscopy performed as well as an EGD, although patient notes that procedures were to be performed this week (and she is currently admitted). She has had multiple issues with her health recently, and she feels that this is largely driving her anxiety.  More recently, the patient has reported dysphagia. She reports that food seems to be passing slower through her esophagus and she has more awareness of swallowing.  She reports that swallowing feels different. She feels that I'm not making enough saliva. She denies any liquid dysphagia, odynophagia, cough, or concern for aspiration. She reports a history of GERD, and reports reflux complaints often. Typically at home, she will use a Tums as needed, although she has been started on PPI therapy and Carafate  here. She reports improvement with starting these medications. She notes nausea and very poor appetite, although no vomiting. Prior to her admission, the patient reports consuming only a few bites of food on a daily basis. She notes losing greater than 30 pounds of weight over the last 3 months unintended. Since her episode of diverticulitis in June, the patient has reported persistent abdominal pain, bilateral lower quadrants noticed  postprandial and with bowel movements.  This occurs on an intermittent basis, and she has had no pain today. She questions whether this could be a spasm. Her bowel habits have also changed and this time. She started to have constipation noted around the time of starting Xanax. At home, she was skipping 3-4 days without bowel movements and she typically would pass  bowel movements on a daily basis. No melena present. Intermittent history of rectal bleeding noted with constipation. The  patient has never had an EGD or colonoscopy performed.  No use of tobacco or alcohol consumption. No use of NSAIDs. No family history of GI malignancy. Most recent labs from April 02, 2014 revealed a bilirubin of 2.85 (otherwise normal liver tests and in the setting of Gilberts), normal WBC, normal hemoglobin, and normal platelet count. 44 over abdominal ultrasound revealing for a slightly hyperechoic lesion in the liver likely representing an atypical hemangioma.  Past Medical History  Diagnosis Date  . Allergy history unknown   . Chronic sinusitis   . Anemia   . Diverticulitis   . Anxiety    Past Surgical History  Procedure Laterality Date  . Appendectomy    . Tubal ligation      Allergies  Allergen Reactions  . Doxycycline Agitation    Anxiety and panic state  . Phenergan [Promethazine] Nausea Only   Infusions:   . NaCl     Medications:  . ALPRAZolam  0.5 mg Oral QID  . calcium  carbonate-vitamin D   1 tablet Oral Daily  . diphenhydrAMINE  25 mg Oral HS  . lactobacillus acidophilus  1 capsule Oral TID  . magnesium  oxide  400 mg Oral BID  . multivitamin plain  1 tablet Oral Daily  . pantoprazole sodium  40 mg Oral Daily  . sucralfate   1 g Oral QID   PRN medications: acetaminophen  Oral,aluminum & magnesium  hydroxide-simethicone  Oral,dicyclomine  Oral,loratadine Oral,NaCl IntraVENous,OLANZapine zydis Oral **OR** OLANZapine Intramuscular,ondansetron  Oral,polyethylene glycol Oral,traZODone Oral  History   Social History  . Marital Status: Married    Spouse Name: N/A    Number of Children: N/A  . Years of Education: N/A   Occupational History  .     Social History Main Topics  . Smoking status: Never Smoker   . Smokeless tobacco: Never Used  . Alcohol Use: No  . Drug Use: No  . Sexual Activity: Not on file   Other Topics Concern  . Not on file   Social History Narrative   Family History  Problem Relation Age of Onset  . Cancer Father 60    brain  . Cancer  Maternal Aunt     breast  . Heart disease Neg Hx   . Diabetes Neg Hx   . Hypothyroidism Mother   . Hypothyroidism Sister   . Hypothyroidism Daughter   . Hypothyroidism Maternal Grandmother    All systems reviewed and are negative except that stated.   Physical Examination  Heart Rate:  [91] 91 Resp:  [18] 18 BP: (113)/(70) 113/70 mmHg Pain Score: 0-No pain       Intakes & Outputs (Last 24 hours) at 04/07/14 0700 Last data filed at 04/06/14 2000  24hr Volume @0700   Intake    480 mL  Output      0 mL  Net    480 mL     General Appearance:  Alert, cooperative, no distress, appears stated age  Head:  Normocephalic,  atraumatic  Eyes:  Conjunctiva/corneas clear  Nose: Nares normal, mucosa normal  Throat: Lips, mucosa, and tongue normal  Neck: Supple, symmetrical, trachea midline  Lungs:   Clear to auscultation bilaterally, respirations unlabored  Breasts:  Deferred  Heart:  S1 and S2 distinct   Abdomen:   Soft, non-distended, + normoactive BS present, no TTP on my exam today  Pelvic: Deferred  Extremities: Extremities normal, atraumatic, no edema  Skin:  Skin color, texture, turgor normal, no rashes or lesions     Neurologic: Alert, oriented x3, nonfocal exam     Results  Labs:  No results found for this or any previous visit (from the past 24 hour(s)). Imaging: Mri Brain Head Wo W Contrast  04/06/2014   MRI brain without and with contrast:  INDICATION:  Major Depression. Anxiety disorder.  TECHNIQUE: Imaging of the brain was performed in 3 planes utilizing a combination of T1, FLAIR, and T2 weighting. Diffusion images were performed. In addition, sagittal and axial T1-weighted sequences were performed after intravenous injection of 10 mL  MultiHance.  COMPARISON: None available  FINDINGS: #  Ventricles: Normal without dilatation, mass effect, or midline shift. Brain parenchyma: There are  a few foci of increased T2 signal intensity in the periventricular white matter  bilaterally. In a patient of this age, this is most consistent with mild chronic microvascular ischemic change. No additional area of signal  abnormality is detected.  Diffusion-weighted images are normal indicating no acute infarct.  No evidence mass or hemorrhage.  There is a small developmental venous anomaly in the right frontal lobe. This is of no clinical significance. No additional area  of abnormal enhancement. #  Sella and parasellar region: Normal. #  Craniovertebral junction: Normal. #  Mastoids and paranasal sinuses: Normal #  Orbits: Normal #  Major cerebral vessels and dural sinuses: Normal flow voids #  Bony structures and scalp: Normal   04/06/2014   IMPRESSION:  Minimal chronic ischemic changes in the periventricular white matter. 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 Otherwise normal MRI of the brain    Electronically Signed: Hoy CHRISTELLA Pouch, PA 04/07/2014 8:24 AM

## 2014-04-08 NOTE — Care Plan (Signed)
 Problem: Discharge Planning Intervention: Discharge needs assessment (edit field to enter specific needs identified) SW met with pt to discuss tx and d/c plans.  Pt states that she had an endoscopy today. Pt states that she is not sure when she will be d/c, and SW asked if she is able to follow up in [place] for med management and pt reports that she is.  SW made an appointment at [facility] and added this to the AVS, which will be faxed to the the facility.  Pt states that she wants to hold off on making another appt with [facility]use for therapy as she may want to start seeing someone at [facility] in [place]. Pt may d/c over the weekend or possibly on Monday.  SW will continue to follow pt's tx and d/c plans.   Hadassah KANDICE Slain, LCSW 04/08/2014 / 3:58 PM

## 2014-04-08 NOTE — Anesthesia Postprocedure Evaluation (Signed)
  Patient: Jennifer Cline Procedure(s): EGD W/ BIOPSY Anesthesia type: MAC  Patient location:  PACU Patient participation:  Patient able to participate in this evaluation at age appropriate level.  Vital signs reviewed and can be found in nursing documentation.   Post vital signs:   stable Level of consciousness:   awake, alert and oriented  Post-anesthesia pain:   adequate analgesia Airway patency:   patent Respiratory:   unassisted, respiration function adequate, spontaneous ventilation Cardiovascular:   stable, blood pressure acceptable and heart rate acceptable Hydration:   adequate hydration Temperature: temperature adequate >96.39F PONV:  nausea and vomiting controlled Regional anesthesia: no block performed  Anesthetic complications:   no

## 2014-04-08 NOTE — Interval H&P Note (Signed)
 H&P reviewed, patient examined and there is NO CHANGE to the patient status.

## 2014-04-10 NOTE — Care Plan (Signed)
 Problem: Discharge Planning Goal: Knowledge of medical problems (What is my main problem?) Outcome: Adequate for Discharge Date Met:  04/10/14 Goal: Knowledge of self care (What do I need to do when I go home?) Outcome: Adequate for Discharge Date Met:  04/10/14 Goal: Knowledge of treatment plan (Why is it important for me to do this?) Outcome: Adequate for Discharge Date Met:  04/10/14 Goal: Knowledge of medication management Outcome: Adequate for Discharge Date Met:  04/10/14  Problem: Coping - Ineffective, Patient/Family Goal: Decrease in symptoms of anxiety Outcome: Adequate for Discharge Date Met:  04/10/14 Goal: Demonstrate improved sleep, appetite, and stable affect Outcome: Adequate for Discharge Date Met:  04/10/14 Goal: Demonstrate increased insight and judgement Outcome: Adequate for Discharge Date Met:  04/10/14 Goal: Knowledge of community resources Outcome: Adequate for Discharge Date Met:  04/10/14 Goal: Participation in patient plan of care Outcome: Adequate for Discharge Date Met:  04/10/14  Problem: Fall Prevention Goal: Absence of falls Outcome: Adequate for Discharge Date Met:  04/10/14  Problem: Nutrition Goal: Nutritional intake, appropriate for patient status Outcome: Adequate for Discharge Date Met:  04/10/14 Goal: Knowledge of prescribed diet Outcome: Adequate for Discharge Date Met:  04/10/14  Problem: Pain - Acute Goal: Reduction in pain sensation Outcome: Adequate for Discharge Date Met:  04/10/14

## 2014-04-10 NOTE — Discharge Summary (Signed)
 [facility]  Discharge Note   Discharge Details  Admit date:         04/03/2014 Discharge date:         04/10/2014  Hospital LOS:    7 days  Active Hospital Problems   Diagnosis Date Noted POA  . *Panic disorder with agoraphobia 02/14/2014 Yes  . Recurrent major depression-severe 04/03/2014 Yes    Resolved Hospital Problems   Diagnosis Date Noted Date Resolved POA  No resolved problems to display.     Medication List    START taking these medications       Instructions   diphenhydrAMINE 25 mg capsule  Dose:  25 mg  For:  Trouble Sleeping  Stop taking on:  04/20/2014  Commonly known as:  BENADRYL      25 mg, Oral, At bedtime     lactobacillus acidophilus Caps  Dose:  1 capsule  For:  probiotic      1 capsule, Oral, 3 times a day     magnesium  oxide 400 Tabs  Dose:  400 mg  For:  anxiety  Commonly known as:  MAG-OX      400 mg, Oral, 2 times a day     polyethylene glycol packet  Dose:  17 g  For:  Constipation  Stop taking on:  04/13/2014  Commonly known as:  MIRALAX      17 g, Oral, 3 times a day       CONTINUE taking these medications       Instructions   ALPRAZolam 0.5 mg tablet  Dose:  0.5 mg  Stop taking on:  02/28/2015  Commonly known as:  XANAX      0.5 mg, Oral, 3 times a day as needed     CALCIUM  + D + K PO      Oral     esomeprazole  40 mg capsule  Dose:  40 mg  Commonly known as:  NEXIUM       40 mg, Oral, 30 minutes before breakfast     fexofenadine  180 mg tablet  Dose:  180 mg  Stop taking on:  08/14/2014  Commonly known as:  ALLEGRA       180 mg, Oral, Daily     multivitamin Tabs  Dose:  1 tablet      1 tablet, Oral, Daily     ondansetron  4 MG tablet  Dose:  4 mg  Commonly known as:  ZOFRAN       4 mg, Oral, Every 8 hours as needed     sucralfate  1 G tablet  Dose:  1 g  Commonly known as:  CARAFATE       1 g, Oral, 4 times a day       STOP taking these medications         dicyclomine   20 mg tablet  Commonly known as:  BENTYL         sertraline 50 mg tablet  Commonly known as:  ZOLOFT            Hospital Course  Attending Physician:       Wadie LOISE Pouch, MD Consulting Services:        GI Indication for Admission:       Inability to function Admission History: Jennifer Cline is a married 58 y.o. Caucasian female female admitted from the emergency department with concerns for inability to function. History of Present Illness This is the first for psychiatric symptoms psychiatric admission and first  admission for Jennifer Cline reports that 6 months ago she was diagnosed with [facility] Spotted Fever. She received doxycycline for 10 days then an additional 7. She reported that she started having panic attacks associated with the doxycycline. She subsequently developed diverticulitis and some other complications requiring additional antibiotics. 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 Since April she has had panic attacks frequently enough that she now avoids leaving her home and reports that his depressive symptoms but denies suicidal homicidal ideations. She's lost 20-30 pounds due to inability the and now seeks hospitalization due to increasing inability to function. Psychosocial stressors involves various themes of loss primarily involving an long. These various events have been going on for approximately a year and she was panic free until the stress of illness in April. She reports that she has not had any previous hospitalizations psychiatrically and has been tried on various antidepressants and psychotropics with low tolerability and or a sense that her head is fuzzy. She's currently is on Xanax but states that the first dose of 0.25 mg caused her to sleep excessively. She reports that she will have bouts of sedation and  then bouts of energy associated with taking this medication. She currently is prescribed Xanax 0.5 mg 3 times a day as needed. She reports that sertraline caused diarrhea and made her feel fuzzy in the head. She was intolerant of Effexor and that it caused her to shake . She is willing to try medications in order to stop her panic attacks and wishes to be discharged as soon as possible  Hospital Course:       Jennifer Cline was admitted to behavioral health adult unit one where she underwent a multidisciplinary treatment. She receive one-to-one, group, recreational, milieu, and psychopharmacological therapies. Her principal concern was panic attacks with associated difficulty eating and sleep difficulties. She had poor tolerance to antidepressant medications specifically SSRIs complaining of nausea and tremors and a hazy feeling in her head. Currently she was prescribed sertraline prior to admission but hadn't tried this medication once or twice. Since she had a medical history of diverticulitis in addition to anxiety attacks A trial of doxepin was given with intolerability. She reported similar side effects to those experienced when she took an SSRI. As noted in the admission history the patient identifies her problems as having onset associated with [facility] Spotted Fever. She has always worried and her history is consistent with generalized anxiety disorder but denies suicidal thinking or primary depressives symptoms other than as a consequence of increasing panic attacks and inability to leave the home due to agoraphobia and avoidance behavior. The patient stated that she had benefited from Benadryl in the past as help her sleep therefore 25 mg of Benadryl was given at bedtime with good response. In addition she was started on magnesium  oxide help decrease anxiety and lactose bacillus acidophilus capsules as a probiotic. Xanax was increased from 0.5 mg 3 times a day to T 4 times a day however the patient  this medication on a 3 times a day regimen. She had success with the first day on Benadryl and that she was able to eat and slept through the night without panic. Prior to hospitalization she was having nocturnal panic attacks. The next day she did not take Benadryl and did not sleep as well as she practices that  morning. When asked why she was not adherent she commented that she does not like being on medications. She was encouraged to adhere to treatment therefore she had 2 successful nights consecutively with the ability to eat the next day and was deemed a candidate for discharge. Patient was seen in consultation by GI with concerns for dysphagia. Endoscopy was unremarkable. Therefore after 7 days of hospitalization the patient was deemed a good candidate for discharge. She and her husband were in agreement with disposition. She requests follow-up in this DOCTOR'S OFFICE AND WAS GIVEN THE OFFICE NUMBER. DISPOSITION NOTED BELOW UNDER POST HOSPITAL CARE SHE ELECTED NOT TO PURSUE. THE RATIONALE WAS GIVEN CONTINUITY OF CARE.  Discharge Mental Status Exam: General: well developed and well nourished Neuromuscular: Alert and oriented x3. Gait normal. Reflexes and motor strength normal and symmetric. Cranial nerves 2-12 and sensation grossly intact. Orientation: See above Appearance: Disheveled                 Hygiene: Fair                                         Eye Contact: Good Behavior: normal Attitude: pleasant/cooperative Mood: less anxious approaching euthymia Affect: congruent Cognition: recent/remote memory, language, and fund of knowledge all normal given education and estimated intelligenc, unless otherwise noted Attention/Concentration: Grossly intact Speech: normal pitch and normal volume Thought: goal directed                 Content: the patient reports that she is doing much better content is  historical Describe: See history of present illness Ideation: The patient reports she has  never been suicidal Judgement/Insight: Fair  Diet: Regular  Activity: Ad lib.  Prognosis: Guarded  Labs and diagnostics: Please refer to the H&P and the medical records   Discharge Diagnosis: Panic disorder Generalized anxiety disorder Major depressive disorder single moderate Gilbert's syndrome Diverticulitis Chronic sinusitis  Acute Safety Risks: acute self-care deficit risk (High  Comorbidities: Active Hospital Problems   Recurrent major depression-severe      Bedside Procedures    No orders found      Condition upon Discharge  Improved and stable as chronicled below: Reena Beverely Baseman, RN Registered Nurse Signed Psychiatry Nursing Note 04/10/2014  1:56 PM    Expand All Collapse All  Discharged at 1356 with mental health tech and husband to lobby has discharge papers rx to be sent to pharmacy denies suicide. Has valuables        04/10/2014  1:30 PM    Expand All Collapse All  Problem: Discharge Planning Goal: Knowledge of medical problems (What is my main problem?) Outcome: Adequate for Discharge Date Met:  04/10/14 Goal: Knowledge of self care (What do I need to do when I go home?) Outcome: Adequate for Discharge Date Met:  04/10/14 Goal: Knowledge of treatment plan (Why is it important for me to do this?) Outcome: Adequate for Discharge Date Met:  04/10/14 Goal: Knowledge of medication management Outcome: Adequate for Discharge Date Met:  04/10/14  Problem: Coping - Ineffective, Patient/Family Goal: Decrease in symptoms of anxiety Outcome: Adequate for Discharge Date Met:  04/10/14 Goal: Demonstrate improved sleep, appetite, and stable affect Outcome: Adequate for Discharge Date Met:  04/10/14 Goal: Demonstrate increased insight and judgement Outcome: Adequate for Discharge Date Met:  04/10/14 Goal: Knowledge of community resources Outcome: Adequate for Discharge Date Met:  04/10/14  Goal: Participation in patient plan of care Outcome:  Adequate for Discharge Date Met:  04/10/14  Problem: Fall Prevention Goal: Absence of falls Outcome: Adequate for Discharge Date Met:  04/10/14  Problem: Nutrition Goal: Nutritional intake, appropriate for patient status Outcome: Adequate for Discharge Date Met:  04/10/14 Goal: Knowledge of prescribed diet Outcome: Adequate for Discharge Date Met:  04/10/14  Problem: Pain - Acute Goal: Reduction in pain sensation Outcome: Adequate for Discharge Date Met:  04/10/14      [facility]      Appointments which have been scheduled    Apr 28, 2014  2:00 PM  New Patient with Karene Mew, MD  [facility]) (--)   [address] [place] [address]  [phone]         Additional instructions:     [facility]          Appt date/time: Thursday 04/28/14 at 2PM (arrive at 1:30) with Dr. Lorrayne [address] [place] [address]  Phone: [phone] Fax: [phone]  Please attend your appointment for medication management services. You may also discuss being referred to see a therapist there, too.   MY SAFETY PLAN  Call the [facility] - [phone] or [phone]  If I cannot reach anyone, I will call 911 for emergency assistance  Go to the nearest hospital emergency department   I understand my safety plan,  I agree to follow my safety plan.  I will go to my follow up appointment.     Future Appointments Date Time Provider Department Center  04/28/2014 2:00 PM Karene Mew, MD [facility] None   Patient elected to follow up at this doctor's office  and was given the office number [phone] to make an appointment.The follow-up was requested by the patient for continuity of care.        Unresulted Labs     Order Current Status   Pathology Tissue Request Collected (04/08/14 1431)      Recommendations to  physicians: none  Justification for two or more antipsychotic medications Is not being discharged on multiple antipsychotic meds   Time spent in discharge process:  less than 30 minutes     Electronically signed: Wadie LOISE Pouch, MD 04/10/2014 / 1:16 PM

## 2014-04-10 NOTE — Nursing Note (Signed)
 Discharged at 64 with mental health tech and husband to lobby has discharge papers rx to be sent to pharmacy denies suicide. Has valuables

## 2014-11-15 ENCOUNTER — Other Ambulatory Visit: Payer: Self-pay | Admitting: Obstetrics and Gynecology

## 2014-11-17 LAB — CYTOLOGY - PAP

## 2015-06-07 IMAGING — CT CT ABD-PELV W/ CM
1 of 3 series · 14 of 32 positions shown, 19 images · IV contrast (OMNIPAQUE 300)
Comparison: None.

CLINICAL DATA: abd pain ANXIETY ABDOMINAL PAIN abd pain ANXIETY
ABDOMINAL PAIN

EXAM:
CT ABDOMEN AND PELVIS WITH CONTRAST
TECHNIQUE: Multidetector CT imaging of the abdomen and pelvis was performed
using the standard protocol following bolus administration of
intravenous contrast.
CONTRAST:  50mL OMNIPAQUE IOHEXOL 300 MG/ML SOLN, 100mL OMNIPAQUE
IOHEXOL 300 MG/ML SOLN

[Series 2: abd/pel with · axial · 0.74mm/px · z∈[-441,-76]mm · 14 of 83 slices shown, 19 images]
[im 5/83  soft-tissue]
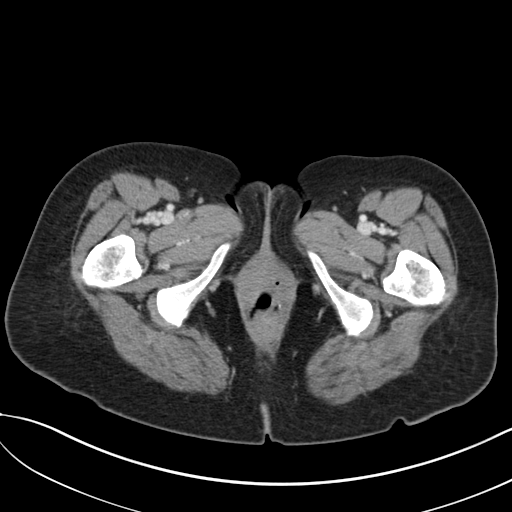
[im 5/83  bone]
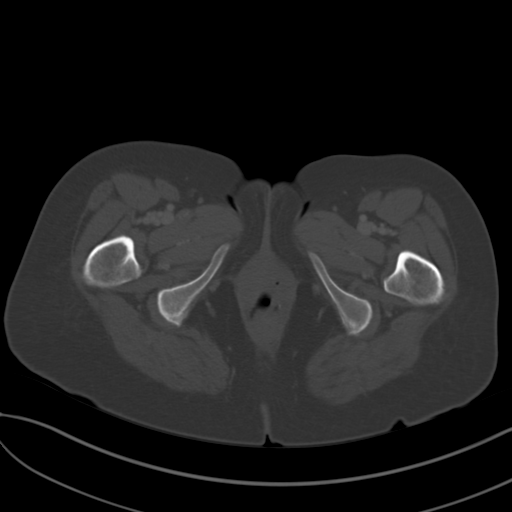
[im 13/83  soft-tissue]
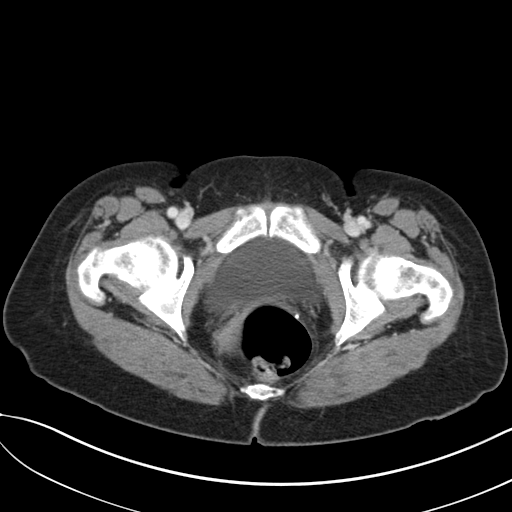
[im 18/83  soft-tissue]
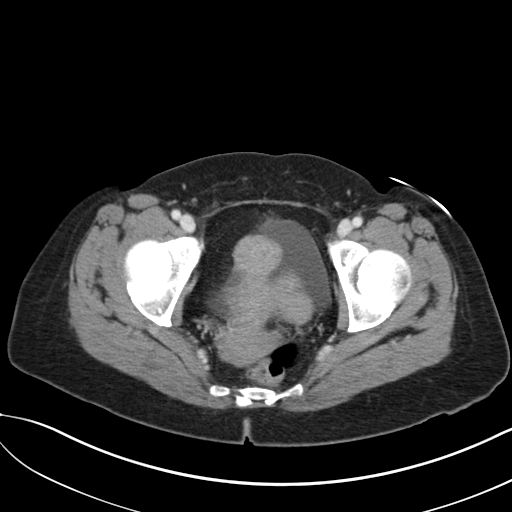
[im 22/83  soft-tissue]
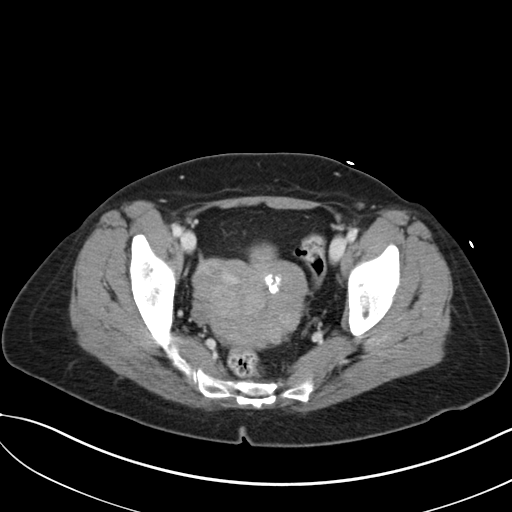
[im 31/83  soft-tissue]
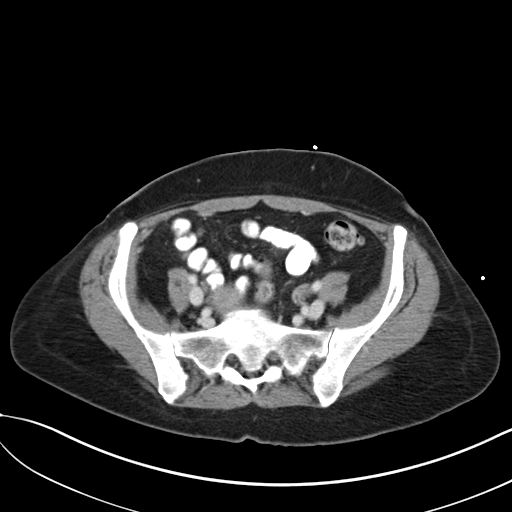
[im 35/83  soft-tissue]
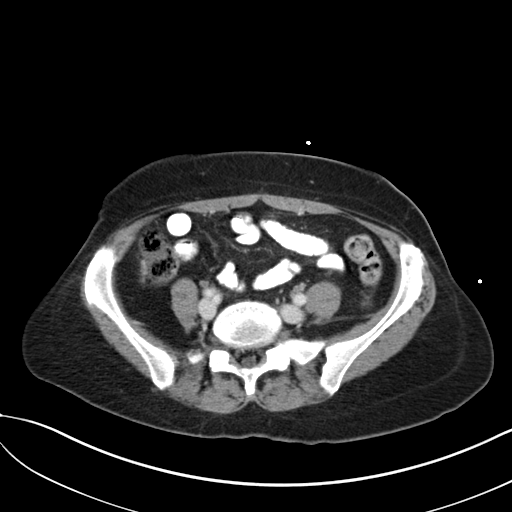
[im 44/83  soft-tissue]
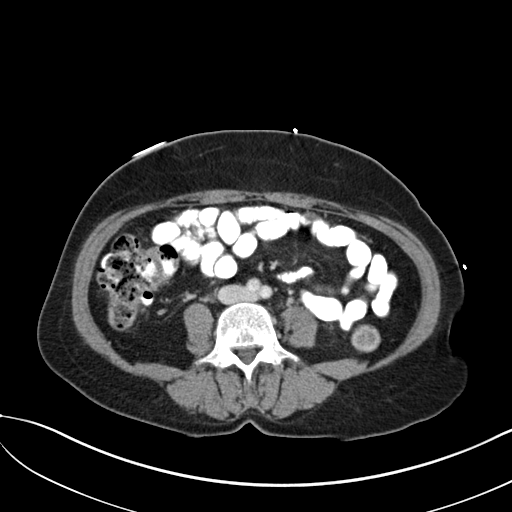
[im 48/83  soft-tissue]
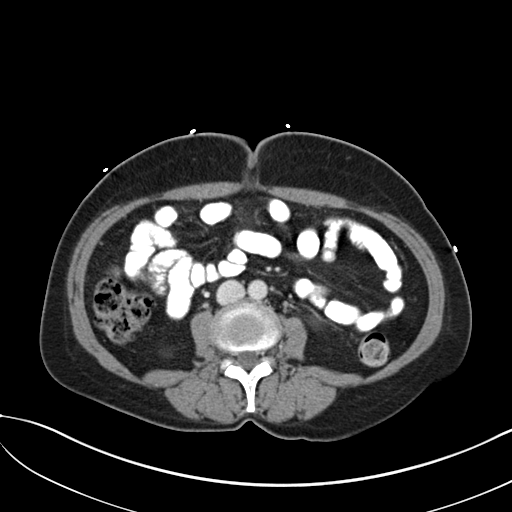
[im 52/83  soft-tissue]
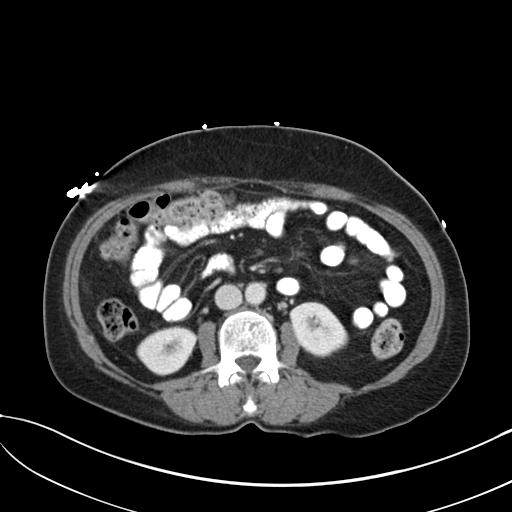
[im 52/83  bone]
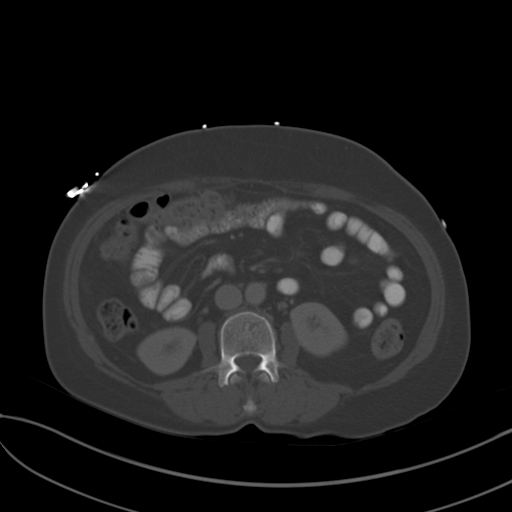
[im 61/83  soft-tissue]
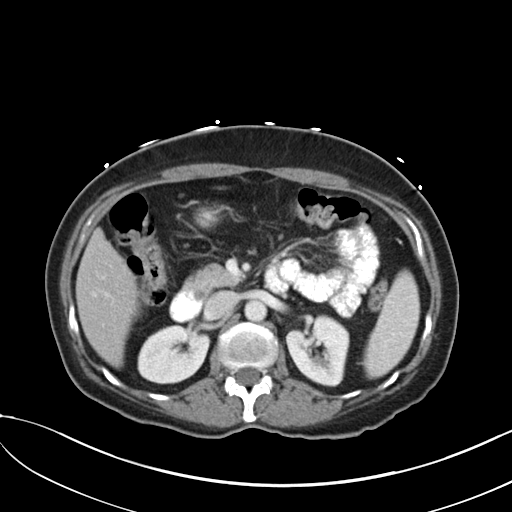
[im 65/83  soft-tissue]
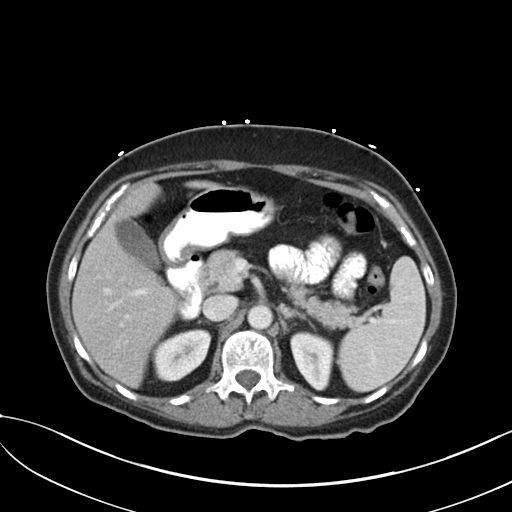
[im 65/83  lung]
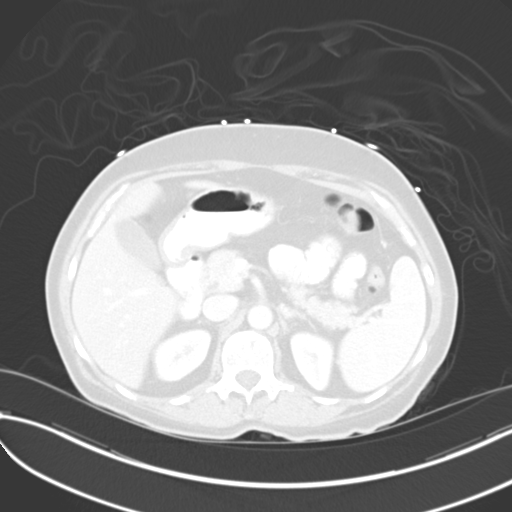
[im 70/83  soft-tissue]
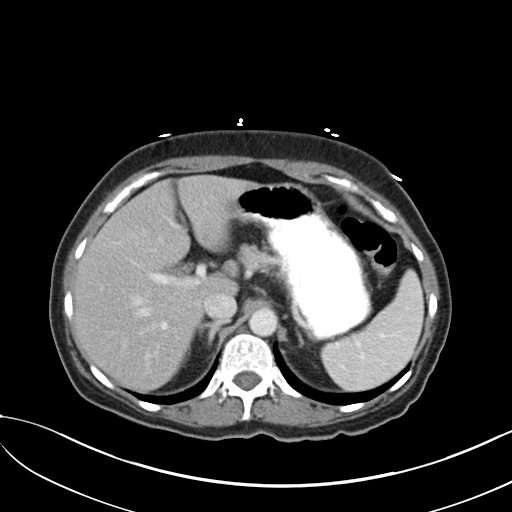
[im 70/83  lung]
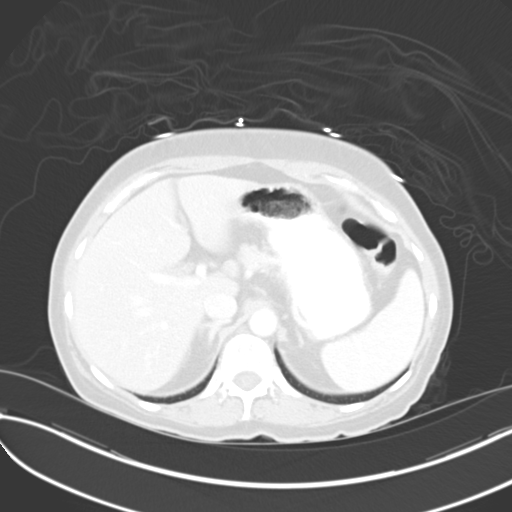
[im 74/83  lung]
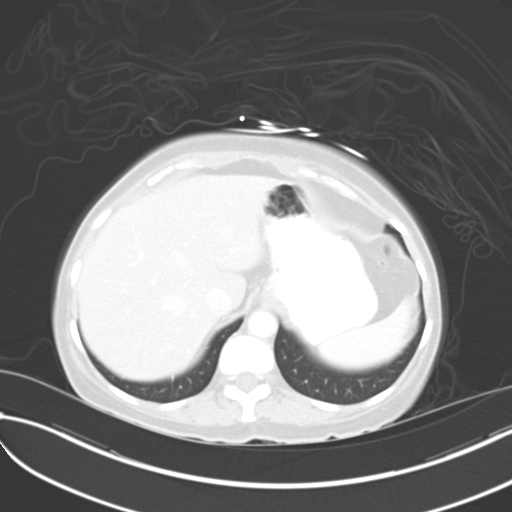
[im 78/83  soft-tissue]
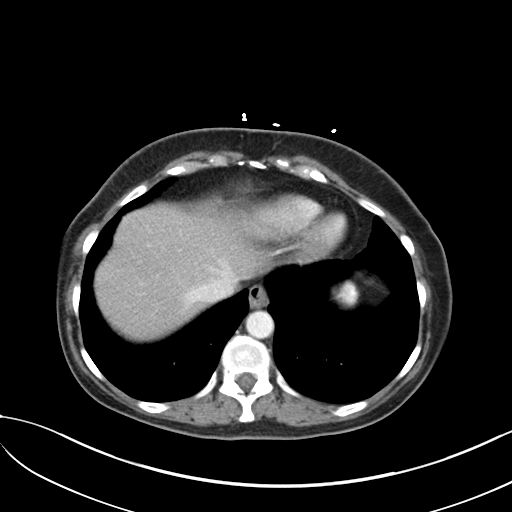
[im 78/83  lung]
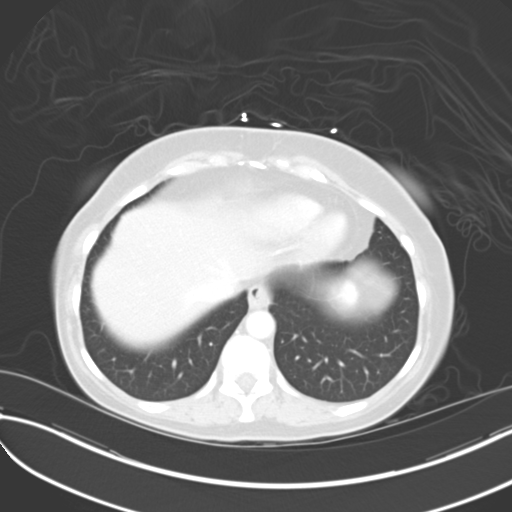

[14 of 32 positions shown; findings below may reference images not displayed]

FINDINGS: Visualized lung bases clear. Focal fatty infiltration adjacent to
the falciform ligament in the liver. Unremarkable nondistended
gallbladder, spleen, adrenal glands, kidneys, pancreas, aorta,
portal vein. Stomach physiologically distended. Small bowel and
colon are nondilated. Appendix surgically absent. Urinary bladder
physiologically distended. Bilateral pelvic phleboliths. Lobular
uterine enlargement with some coarse calcifications consistent with
multiple fibroids. No ascites. No free air. No adenopathy localized.
No hydronephrosis. Lumbar spine unremarkable.
IMPRESSION: 1. No acute abdominal process.
2. Uterine fibroids.

## 2016-05-10 ENCOUNTER — Other Ambulatory Visit: Payer: Self-pay | Admitting: Obstetrics and Gynecology

## 2016-05-10 DIAGNOSIS — R928 Other abnormal and inconclusive findings on diagnostic imaging of breast: Secondary | ICD-10-CM

## 2016-05-21 ENCOUNTER — Other Ambulatory Visit: Payer: Self-pay

## 2016-06-18 ENCOUNTER — Other Ambulatory Visit: Payer: Self-pay

## 2016-06-26 ENCOUNTER — Ambulatory Visit
Admission: RE | Admit: 2016-06-26 | Discharge: 2016-06-26 | Disposition: A | Discharge status: Home / Self Care | Payer: Self-pay | Source: Ambulatory Visit | Attending: Obstetrics and Gynecology | Admitting: Obstetrics and Gynecology

## 2016-06-26 DIAGNOSIS — R928 Other abnormal and inconclusive findings on diagnostic imaging of breast: Secondary | ICD-10-CM

## 2018-01-08 ENCOUNTER — Other Ambulatory Visit (HOSPITAL_COMMUNITY): Payer: Self-pay | Admitting: Obstetrics and Gynecology

## 2018-01-08 DIAGNOSIS — E042 Nontoxic multinodular goiter: Secondary | ICD-10-CM

## 2018-01-08 NOTE — Progress Notes (Signed)
 Subjective:    Jennifer Cline is a 62 y.o. female who presents for evaluation of sinus pain. Symptoms include: congestion, facial pain, headaches, nasal congestion, post nasal drip and sinus pressure. Onset of symptoms was 2 weeks ago. Symptoms have been unchanged since that time. Past history is significant for no history of pneumonia or bronchitis. Patient is a non-smoker.  The following portions of the patient's history were reviewed and updated as appropriate: allergies, current medications, past family history, past medical history, past social history, past surgical history and problem list.  Review of Systems Pertinent items are noted in HPI.  Objective:   Vitals:   01/08/18 1247  BP: 115/71  Pulse: 74  Resp: 20  Temp: 98 F (36.7 C)  SpO2: 97%    General appearance: alert, appears stated age and cooperative Head: Normocephalic, without obvious abnormality, atraumatic Eyes: negative Ears: abnormal TM right ear - bulging and abnormal TM left ear - bulging Nose: moderate congestion, sinus tenderness bilateral Throat: lips, mucosa, and tongue normal; teeth and gums normal Neck: no adenopathy and supple, symmetrical, trachea midline Lungs: clear to auscultation bilaterally Heart: regular rate and rhythm, S1, S2 normal, no murmur, click, rub or gallop Neurologic: Grossly normal   Assessment:   1. Acute recurrent maxillary sinusitis       Plan:   Medication as directed  OTC medication prn Recheck as needed

## 2018-01-15 ENCOUNTER — Encounter (HOSPITAL_COMMUNITY): Payer: Self-pay

## 2018-01-15 ENCOUNTER — Ambulatory Visit (HOSPITAL_COMMUNITY): Payer: Self-pay

## 2019-02-11 NOTE — Progress Notes (Signed)
 Subjective:    Sinus Pain Jennifer Cline is a 63 y.o. female who complains of congestion, facial pain, headaches and nasal congestion. This is a new problem/complaint. Onset of symptoms was 8 days ago. Symptoms have gradually worsened since that time. Past history is significant for nothing. Patient is non-smoker. Associated symptoms include congestion and coryza.  She is drinking plenty of fluids. Evaluation to date: none. Treatment to date: cough suppressants and decongestants which provided minimal significant relief. The patient denies fever, chills, night sweats, rash, headache, anorexia, arthralagias, diarrhea, nausea and vomiting.  She also complains of bug bite to right upper arm and tick bite to left lower leg. Tick bite was 8 days ago. Bug bite was first noticed   The following portions of the patient's history were reviewed and updated as appropriate: allergies, current medications, past family history, past medical history, past social history, past surgical history and problem list.  Review of Systems Pertinent items are noted in HPI.   Objective:   BP 140/83   Pulse 79   Temp (!) 96.6 F (35.9 C) (Temporal)   Resp 18   Wt 170 lb (77.1 kg)   SpO2 98%   BMI 31.09 kg/m  General appearance: alert, appears stated age, cooperative and no distress Head: Normocephalic, without obvious abnormality, atraumatic Eyes: conjunctivae/corneas clear.  Ears: bilat TMs and EACs normal Nose: right turbinate red, edematous, left turbinate red, edematous. Mucous present to bilateral nares Throat: lips, mucosa, and tongue normal; teeth and gums normal. Posterior oropharynx slightly erythematous  Neck: no adenopathy, supple,  no tenderness/mass/nodules Lungs: clear to auscultation bilaterally. Normal effort Heart: regular rate and rhythm, S1, S2 normal, no murmur, click, rub or gallop Skin: Skin color, texture, turgor normal. No rashes or lesions   Laboratory Testing Strep: Not  done Influenza: Not done       Assessment:   1. Acute non-recurrent maxillary sinusitis  amoxicillin-clavulanate (AUGMENTIN) 875-125 mg per tablet  2. Insect bite of right upper arm, initial encounter  amoxicillin-clavulanate (AUGMENTIN) 875-125 mg per tablet  3. Tick bite of calf, initial encounter  amoxicillin-clavulanate (AUGMENTIN) 875-125 mg per tablet      Plan:   1. Labs and medications from today's visit: No orders of the defined types were placed in this encounter.  Outpatient Encounter Medications as of 02/11/2019  Medication Sig Dispense Refill  . ALPRAZolam (XANAX) 0.25 mg tablet TAKE 1/2 TABLET BY MOUTH IN THE MORNING AND 1/2 TABLET IN THE EVENING. 30 tablet 0  . famotidine (PEPCID) 20 MG tablet Take 1 tablet by mouth twice daily 60 tablet 0  . fexofenadine  (ALLEGRA ) 60 mg tablet Take 60 mg by mouth daily.    SABRA Phenylephrine-Acetaminophen  (TYLENOL  SINUS CONGESTION/PAIN PO) Take by mouth.    SABRA amoxicillin-clavulanate (AUGMENTIN) 875-125 mg per tablet Take one tablet by mouth 2 (two) times daily for 10 days. 20 tablet 0   No facility-administered encounter medications on file as of 02/11/2019.     Medications:  per orders. Warm, moist compresses over sinuses Nasal saline spray or Neti Pot Avoid antihistamines  2. diagnosis and treatment of sinusitis.  Suggested symptomatic OTC remedies. Nasal saline spray for congestion. Drink plenty of fluids, particularly H2O. Steam from hot showers. Cool mist humidifier at bedtime. Patient has been instructed on medications, dosing, side effects, and possible interactions. I have educated patient on supportive care therapy associated with diagnoses above.  Reassurance. Good ER instructions provided. VSS. Discharged in stable condition.   3. If symptoms persist  or worsen despite treatment, please follow up with your PCP or go directly to the Emergency Department..  4. The patient indicates understanding of these issues and agrees  with the plan.  Education material/home care instructions distributed and red flags reviewed. All questions answered.  After Visit Summary was given to patient

## 2019-04-08 NOTE — Progress Notes (Signed)
 Referring Provider Maurilio Satterfield V, PA  Reason for consult:  1. Chest pain, unspecified type   2. Palpitations     Chronic Problem List:  Patient Active Problem List  Diagnosis  . Perennial allergic rhinitis  . Diverticulitis - bouts on 6.2014 and 6.2015  . 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 Agoraphobia with panic disorder  . Benzodiazepine dependence (*)  . Depressive disorder  . High risk medication use  . Medication management contract signed  . Class 1 obesity without serious comorbidity with body mass index (BMI) of 31.0 to 31.9 in adult  . Chest pain    History of Present Illness Jennifer Cline is 64 y.o. year old female with a history of longstanding anxiety who is currently weaning off of alprazolam who presents to [facility] cardiology for recurrent atypical chest pain and palpitations.  This patient is currently reducing her dosage of alprazolam slowly and is noted some palpitations and tightness in her chest at various times during the day.  This seems to be exaggerated since the slow drug withdrawal was started.  She denies a previous history of myocardial infarction, congestive heart failure, or valvular heart disease.  She is a non-smoker.  Her father died in advanced age from congestive heart failure there is no other cardiovascular disease in the family.  Her HDL is 52.  She has quite a bit of anxiety about the symptoms and we will perform some testing to be sure that her cardiovascular status is acceptable.  Past Medical History Past Medical History:  Diagnosis Date  . Allergy history unknown   . Anemia   . Anxiety   . Cancer (*)    basal cell carcinoma  . Chronic sinusitis   . Depression   . Diverticulitis     Past Surgical History Past Surgical History:  Procedure Laterality Date  . Appendectomy    . Colonoscopy    . Tubal ligation    . 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  Allergies Doxycycline and Phenergan [promethazine]  Medications  Current Outpatient Medications:  .  ALPRAZolam (XANAX) 0.25 mg tablet, TAKE 1/2 TABLET BY MOUTH IN THE MORNING AND 1/2 TABLET IN THE EVENING., Disp: 30 tablet, Rfl: 0 .  famotidine (PEPCID) 20 MG tablet, Take 1 tablet by mouth twice daily, Disp: 60 tablet, Rfl: 0 .  fexofenadine  (ALLEGRA ) 60 mg tablet, Take 60 mg by mouth daily., Disp: , Rfl:  .  Phenylephrine-Acetaminophen  (TYLENOL  SINUS CONGESTION/PAIN PO), Take by mouth., Disp: , Rfl:   Review of Systems A comprehensive review of systems was negative.  Physical Examination Vitals: Blood pressure 148/88, pulse 77, temperature 97.1 F (36.2 C), resp. rate 16, height 5' 2 (1.575 m), weight 169 lb 11.2 oz (77 kg), SpO2 97 %, not currently  breastfeeding. General:  Pleasant, cooperative, and in no acute distress.  Alert and oriented. HEENT:  Sclera clear, anicteric.  No thyromegaly or lymphadenopathy noted. Cardiovascular:  S1, S2 normal, no murmur, rub or gallop, regular rate and rhythm. Carotid upstokes are normal and without bruits.  The peripheral pulses are palpable and symmetric. Lungs: chest clear, no wheezing, rales, normal symmetric air entry, Heart exam - S1, S2 normal, no murmur, no gallop, rate regular Abdomen:  abdomen is soft without significant tenderness, masses, organomegaly or guarding.  No  masses, bruits, or hepatosplenomegaly noted. Skin:  Normal texture with no significant lesions. Extremities:  No significant clubbing or cyanosis. No  edema Psychiatric:  Normal affect.  Mood within normal limits. Neurologic:  Grossly nonfocal.  Accessory Clinical Data  Lab Results  Component Value Date   Glucose 92 01/26/2019   CALCIUM  9.7 01/26/2019   Sodium 144 01/26/2019   Potassium 4.6 01/26/2019   CO2 24 01/26/2019   Chloride 104 01/26/2019   BUN 11 01/26/2019   Creatinine, Serum 0.81 01/26/2019   Lab Results  Component Value Date   WBC 5.4 01/26/2019   Hemoglobin 14.6 01/26/2019   Hematocrit 44.6 01/26/2019   MCV 85 01/26/2019   Platelet Count 309 01/26/2019   Lab Results  Component Value Date   CK-MB 1.25 12/08/2013   CK 73 12/08/2013   Troponin T <0.010 12/08/2013   No results found for: INR, PROTIME  Impression Atypical chest discomfort Palpitations HDL 52 Few risk factors for cardiovascular disease    Plan Nuclear stress scan to exclude ischemia 30-day event recorder to rule out significant arrhythmia She will return to clinic in 6 weeks to go over these results, hopefully that will be benign     Thank you for allowing us  to participate in the care of this patient.   Sherron DELENA Champ, MD

## 2020-05-26 NOTE — ED Provider Notes (Signed)
 [facility]  ED Provider Note  Jennifer Cline 64 y.o. female DOB: 06/12/1956 MRN: [id number] History   Chief Complaint  Patient presents with  . Dizziness    Woke up around 0500 this morning with HA. feels like she can't walk right. Past hx of vertigo.  Took a motion sickness pill this morning thinking that it may help.      History provided by:  Patient Dizziness Quality:  Imbalance Severity:  Severe Onset quality:  Sudden Duration:  2 hours Timing:  Constant Progression:  Unchanged Chronicity:  New Context comment:  Awoke today with a headache and trouble walking Relieved by:  Nothing Worsened by:  Standing up and sitting upright Ineffective treatments: dramamine. Associated symptoms: headaches and nausea   Associated symptoms: no blood in stool, no chest pain, no diarrhea, no palpitations, no shortness of breath, no vomiting and no weakness  Visual change: blurred.   Associated symptoms comment:  Chronic nasal congestion Risk factors: no hx of stroke       Past Medical History:  Diagnosis Date  . Allergy history unknown   . Anemia   . Anxiety   . Cancer (*)    basal cell carcinoma  . Chronic sinusitis   . Depression   . Diverticulitis     Past Surgical History:  Procedure Laterality Date  . Appendectomy    . Colonoscopy    . Tubal ligation    . Upper gastrointestinal endoscopy  2015    Social History   Substance and Sexual Activity  Alcohol Use No   Social History   Tobacco Use  Smoking Status Never Smoker  Smokeless Tobacco Never Used   E-Cigarettes  . Vaping Use Never User   . Start Date    . Cartridges/Day    . Quit Date     Social History   Substance and Sexual Activity  Drug Use No         Allergies  Allergen Reactions  . Doxycycline Agitation    Anxiety and panic state  . Phenergan [Promethazine] Nausea Only    Home Medications   ALPRAZOLAM (XANAX) 0.25 MG TABLET    TAKE 1/2 TABLET BY  MOUTH IN THE MORNING AND 1/2 TABLET IN THE EVENING.   CEPHALEXIN (KEFLEX) 500 MG CAPSULE    Take one capsule (500 mg dose) by mouth 2 (two) times daily.   FAMOTIDINE (PEPCID) 20 MG TABLET    Take 1 tablet by mouth twice daily   FEXOFENADINE  (ALLEGRA ) 60 MG TABLET    Take 60 mg by mouth daily.   PHENYLEPHRINE-ACETAMINOPHEN  (TYLENOL  SINUS CONGESTION/PAIN PO)    Take by mouth.     Review of Systems   Review of Systems  Constitutional: Negative for chills and fever.  HENT: Negative for ear pain and sore throat.   Eyes: Negative for pain and visual disturbance.  Respiratory: Negative for cough and shortness of breath.   Cardiovascular: Negative for chest pain and palpitations.  Gastrointestinal: Positive for nausea. Negative for abdominal pain, blood in stool, diarrhea and vomiting.  Genitourinary: Negative for dysuria and hematuria.  Musculoskeletal: Negative for arthralgias and back pain.  Skin: Negative for color change and rash.  Neurological: Positive for dizziness and headaches. Negative for seizures and syncope.  All other systems reviewed and are negative.   Physical Exam   ED Triage Vitals [05/26/20 0649]  BP (!) 172/77  Heart Rate 83  Resp 20  SpO2 100 %  Temp 97.9 F (  36.6 C)    Physical Exam  Constitutional: She appears well-developed and well-nourished. She no respiratory distress.  HENT:  Head: Normocephalic.  Eyes: Conjunctivae are normal. Right eye: no drainage. Left eye: no drainage.  Pulmonary/Chest: No respiratory distress. Respiratory effort normal.  Musculoskeletal: No obvious deformity noted to extremities. no edema.  Neurological: She is alert and oriented to person, place, and time. Moves all extremities equally. She has normal speech. Cranial nerves intact II through XII. Finger to nose intact bilaterally. Sensation intact to light touch, bilateral upper and lower extremities. Strength 5/5 bilateral upper and lower extremities.  She is unable to sit up  on the side of the bed without profound dizziness, and she was unable to walk due to her symptoms  Skin: Skin is warm. Skin is dry.  Psychiatric: She has a normal mood and affect. Her behavior is normal.    ED Course   Lab results:   CBC AND DIFFERENTIAL - Abnormal      Result Value   WBC 7.2     RBC 4.99 (*)    HGB 14.1     HCT 42.3     MCV 85     MCH 28.3     MCHC 33.3     Plt Ct 273     RDW SD 40.0     MPV 10.1     NRBC% 0.0     NRBC 0.000     NEUTROPHIL % 70.3 (*)    LYMPHOCYTE % 20.8 (*)    MONOCYTE % 6.8     Eosinophil % 1.1     BASOPHIL % 0.6     IG% 0.400     ABSOLUTE NEUTROPHIL COUNT 5.05     ABSOLUTE LYMPHOCYTE COUNT 1.5     MONO ABSOLUTE 0.5     EOS ABSOLUTE 0.1     BASO ABSOLUTE 0.0     IG ABSOLUTE 0.030    COMPREHENSIVE METABOLIC PANEL - Abnormal   Na 138     Potassium 4.1     Cl 103     CO2 26     AGAP 9     Glucose 119 (*)    BUN 12     Creatinine 0.66     Ca 9.8     ALK PHOS 80     T Bili 1.69 (*)    Total Protein 6.6     Alb 4.5     GLOBULIN 2.1     ALBUMIN/GLOBULIN RATIO 2.1     BUN/CREAT RATIO 18.2     ALT 22     AST 21     GFR AFRICAN AMERICAN 107     Comment: African-American:  Normal GFR (glomerular filtration rate) > 60 mL/min/1.73 meters squared. < 60 may include impaired kidney function based on creatinine, age, legal sex, and race normalized to accepted average body surface area    GFR Non African American 93     Comment: Non African-American:  Normal GFR (glomerular filtration rate) > 60 mL/min/1.73 meters squared. < 60 may include impaired kidney function based on creatinine, age, legal sex, and race normalized to accepted average body surface area     Imaging:   CT HEAD WO IV CONTRAST   Narrative:    INDICATION:    Dizziness, non-specific,  COMPARISON:   None.  TECHNIQUE:    Multiple axial images obtained from the skull base to the vertex without IV contrast  were obtained on  05/26/2020 7:17 AM  FINDINGS:  No  evidence of hydrocephalus. No intracranial mass, mass effect or midline shift. No acute infarction evident. No intracranial hemorrhage. Paranasal sinuses: Clear. Mastoid air cells: Clear. Calvarium: Intact.    Impression:    IMPRESSION: Negative head CT.  Electronically Signed by: Lamar Pedlar    ECG: ECG Results        ECG 12 lead (In process)  Result time 05/26/20 08:25:03   In process            Narrative:   Diagnosis Class Abnormal Acquisition Device D3K Systolic BP 150 Diastolic BP 72 Ventricular Rate 74 Atrial Rate 74 P-R Interval 162 QRS Duration 90 Q-T Interval 400 QTC Calculation(Bazett) 444 Calculated P Axis 64 Calculated R Axis 28 Calculated T Axis 65  Diagnosis Normal sinus rhythm RSR' or QR pattern in V1 suggests right ventricular conduction delay Nonspecific T wave abnormality Abnormal ECG When compared with ECG of 03-Apr-2014 10:58, Nonspecific T wave abnormality no longer evident in Inferior leads                                    Pre-Sedation Procedures    MDM Number of Diagnoses or Management Options Sinusitis chronic, frontal: new and requires workup Vertigo: new and requires workup Diagnosis management comments: Patient is 64 years old who presents with headache and dizziness.  Her dizziness seems to be very peripheral in nature as it is exacerbated by position changes.  She also has a history of chronic sinusitis and feels that it has been worse recently.  Nonetheless, because she had a headache today, I did evaluate her for emergent etiologies of dizziness such as subarachnoid hemorrhage, intracranial hemorrhage, mass, or other neurologic emergency.  While I recognize limitations of CT, I do not think her case warrants an MRI.  She is neurologically normal and most likely has a peripheral source of symptoms.  I have agreed to prescribe an antibiotic for her chronic sinusitis symptoms.  She was also given meclizine.     Amount and/or Complexity of Data Reviewed Clinical lab tests: ordered and reviewed Tests in the radiology section of CPT: ordered and reviewed Tests in the medicine section of CPT: reviewed and ordered Review and summarize past medical records: yes  Risk of Complications, Morbidity, and/or Mortality Presenting problems: high Diagnostic procedures: moderate Management options: moderate  Patient Progress Patient progress: stable  Coding      Provider Communication  New Prescriptions   CEFDINIR (OMNICEF) 300 MG CAPSULE    Take one capsule (300 mg dose) by mouth 2 (two) times daily for 10 days.      Quantity: 20 capsule    Refills: 0   MECLIZINE HCL (ANTIVERT) 25 MG TABLET    Take one tablet (25 mg dose) by mouth 3 (three) times a day as needed for Dizziness for up to 3 days.      Quantity: 9 tablet    Refills: 0    Modified Medications   No medications on file    Discontinued Medications   ALPRAZOLAM (XANAX) 0.25 MG TABLET    TAKE 1/2 TABLET BY MOUTH IN THE MORNING AND 1/2 TABLET IN THE EVENING.   CEPHALEXIN (KEFLEX) 500 MG CAPSULE    Take one capsule (500 mg dose) by mouth 2 (two) times daily.   FEXOFENADINE  (ALLEGRA ) 60 MG TABLET    Take 60 mg by mouth daily.    Clinical Impression  Final diagnoses:  Vertigo  Sinusitis chronic, frontal    ED Disposition    ED Disposition Comment   Discharge                  Electronically signed by:   Therisa KANDICE Silvan, MD 05/26/20 [phone]

## 2021-05-31 NOTE — Progress Notes (Signed)
 Subjective:   Jennifer Cline is a 65 y.o. female who presents to establish primary care and medication refills.   Has been taking since January. ASCVD risk 8.2%. Total 187, HDL 45, LDL 113 and Triglycerides 835. Initially given atorvastatin. Did not tolerate. Then prescribed pravastatin but reports it still causes joint and muscle aches. Prefers to not have to take a daily medication. No hx HTN. No hx CVD. Has been working on diet and exercise and trying to lose weight.   Cholesterol:  Patient here for follow up on cholesterol management.  Current treatment includes Pravastatin.  Patient is compliant with treatment.  No reports of issues.  + arthralgias.  + myalgias.  The 10-year CVD risk score (D'Agostino, et al., 2008) is: 7.8%   Values used to calculate the score:     Age: 74 years     Sex: Female     Diabetic: No     Tobacco smoker: No     Systolic Blood Pressure: 122 mmHg     Is BP treated: No     HDL Cholesterol: 45 mg/dL     Total Cholesterol: 187 mg/dL   Review of Systems - All other systems were reviewed and are negative unless stated in HPI.  Family History  Problem Relation Age of Onset  . Cancer Father 75       brain  . Hypothyroidism Mother   . Heart disease Mother        chf  . Cancer Maternal Aunt        breast  . Hypothyroidism Sister   . Breast cancer Sister   . Hypothyroidism Daughter   . Migraines Daughter   . Hypothyroidism Maternal Grandmother   . No Known Problems Brother   . Breast cancer Sister   . No Known Problems Sister   . No Known Problems Sister   . No Known Problems Brother   . Hypothyroidism Daughter   . Diabetes Neg Hx   . Colon cancer Neg Hx   . Colon polyps Neg Hx    Past Medical History:  Diagnosis Date  . Allergy history unknown   . Anemia   . Anxiety   . Cancer (*)    basal cell carcinoma  . Chronic sinusitis   . Depression   . Diverticulitis    Past Surgical History:  Procedure Laterality Date  . Appendectomy     . Colonoscopy    . Tubal ligation    . Upper gastrointestinal endoscopy  2015   Pediatric History  Patient Parents  . Not on file   Other Topics Concern  . Not on file  Social History Narrative  . Not on file    Objective:   BP 122/72 (BP Location: Left arm, Patient Position: Sitting)   Pulse 70   Temp 98.5 F (36.9 C) (Temporal)   Resp 16   Ht 5' 2 (1.575 m)   Wt 155 lb 12.8 oz (70.7 kg)   SpO2 98%   Breastfeeding No   BMI 28.50 kg/m  Gen: Alert, oriented, non toxic, and well hydrated.  No signs of acute distress or pain. Head: Normocephalic.  Atraumatic.  Sclera anicetric Eyes: Extraocular movements intact.  Conjunctiva clear.   Pharynx: Oral pharynx clear, moist mucous membranes. Neck: Full range of motion, no meningmus.  No lymphadenopathy Respiratory:  Lungs clear to auscultation.  No use of accessory muscles. Cardiovascular: Regular rate and rhythm.  No murmurs noted Abdominal:  Soft, non tender, non distended.  No hepatosplenomegally Neuro: Cranial nerves intact grossly.  No loss of strength, sensation.  Pedal sensation intact bilaterally. Extremities:  Full range of motion.  No cyanosis, clubbing, or edema.   Skin:  No rashes noted or bruising. Psych: Oriented, alert.  Mood and affect are normal.  Assessment:   1. Encounter to establish care      2. Hyperlipidemia, unspecified hyperlipidemia type        Plan:  No orders of the defined types were placed in this encounter.  - Had discussion with patient regarding ASCVD risk. Understands current guidelines recommend statins when 10% or high but discussion regarding lifestyle modifications vs benefit of medication when 5-10%. We are going to draw a lipid panel tomorrow and pending results, will discuss continuing the pravastatin or consider changing to zetia since unwanted side effects. RTC 07/21/2021 for annual physical.  - I've reviewed pt's previous lab work and OV notes over the past 6 months to 1  year. - Continue healthy lifestyle modifications - Take meds as prescribed - RTC in 1 months for re-evaluation - I discussed this diagnosis with the patient and discussed the treatment plan with them.  This treatment plan is also outlined in the Patient Instructions and a copy of this was provided to the patient.

## 2021-10-30 NOTE — Progress Notes (Signed)
 Subjective:  Jennifer Cline is a 66 y.o. female here for evaluation of UTI.  Symptoms consistent with dysuria, urinary frequency and urgency. Symjptoms started last week. Having a difficult time passing urine. Burning. Flank pain. Some nausea. No fever. Has been drinking a lot of water.   Having a headache today. Usually takes tylenol  for this. Her BP is elevated today. Not on medication.   Review of Systems - All other systems were reviewed and are negative unless stated in HPI.  Family History  Problem Relation Age of Onset  . Cancer Father 7       brain  . Hypothyroidism Mother   . Heart disease Mother        chf  . Cancer Maternal Aunt        breast  . Hypothyroidism Sister   . Breast cancer Sister   . Hypothyroidism Daughter   . Migraines Daughter   . Hypothyroidism Maternal Grandmother   . No Known Problems Brother   . Breast cancer Sister   . No Known Problems Sister   . No Known Problems Sister   . No Known Problems Brother   . Hypothyroidism Daughter   . Diabetes Neg Hx   . Colon cancer Neg Hx   . Colon polyps Neg Hx    Past Medical History:  Diagnosis Date  . Allergy history unknown   . Anemia   . Anxiety   . Cancer (*)    basal cell carcinoma  . Chronic sinusitis   . Depression   . Diverticulitis    Past Surgical History:  Procedure Laterality Date  . Appendectomy    . Colonoscopy    . Tubal ligation    . Upper gastrointestinal endoscopy  2015   Pediatric History  Patient Parents  . Not on file   Other Topics Concern  . Not on file  Social History Narrative  . Not on file    Objective:   BP (!) 154/76 (BP Location: Left arm)   Pulse 74   Temp (!) 96.6 F (35.9 C) (Temporal)   Resp 16   Ht 5' 2 (1.575 m)   Wt 156 lb (70.8 kg)   SpO2 99%   Breastfeeding No   BMI 28.53 kg/m  Gen: Alert, oriented, non toxic, and well hydrated.  No signs of acute distress. Head: Normocephalic.  Atraumatic.   Neck: Supple. Respiratory:  Lungs  clear to auscultation.  No use of accessory muscles. Cardiovascular: Regular rate and rhythm.  No murmurs noted Abdominal:  Soft, non tender, non distended.  No hepatosplenomegally.  There is no CVA tenderness bilaterally. Neuro: Cranial nerves intact grossly.  No loss of strength, sensation Extremities:  Full range of motion.   Skin:  No rashes noted Psych: Oriented, alert.  Assessment:   1. Dysuria  POCT Urinalysis Dipstick   Culture, Urine Comprehensive Urine Urine, Clean Catch    2. Hematuria, unspecified type  Culture, Urine Comprehensive Urine Urine, Clean Catch    3. Flank pain  Culture, Urine Comprehensive Urine Urine, Clean Catch    4. Acute cystitis with hematuria  nitrofurantoin, macrocrystal-monohydrate, (MACROBID) 100 mg capsule      Plan:   - Sx and U/A consistent with UTI.   - Urine culture pending - Take all of the antibiotics as prescribed. - monitor bp at home and notify provider if readings are consistently greater than 140/90 - I discussed this diagnosis with the patient and discussed the treatment plan with them.  This treatment plan is also outlined in the Patient Instructions and a copy of this was provided to the patient.- - Patient  verbalized to me that they understood what their problem is, what they need to do about it, and why it is important that they do it.  The patient/family voices understanding of all medications. No barriers to adherence were noted. Patient is taking all medications as prescribed and is tolerating well.  Plan for follow-up as discussed or as needed if any worsening symptoms or change in condition.  After Visit Summary was given to patient.

## 2022-12-10 NOTE — ED Provider Notes (Signed)
 [facility]  ED Provider Note  Sussie Minor 67 y.o. female DOB: [birth year] MRN: [id number]  Tele-Medical screening initiated and orders placed by [name], PA. 12/10/2022 / 9:38 PM  67 y.o. female presents with complaints if pain on the right side of her head with a feeling of fluid behind her right ear and pain in the top of the neck.  On Augmentin 875/125 for the past 8 days, improved on antibiotics and prednisone.  Reports history of seasonal allergies, not currently on any medication.  Denies using nasal steroids or saline.  During ROS reports blurry vision off and on for the past 3 to 4 months that she attributes to her allergies.  Denies fever, chills.  Able to put chin to chest without any worsening headache.  No other complaints or concerns. Anxious, NAD, no meningeal signs, afebrile, nontoxic in appearance.  CT head, C-spine ordered on presentation.  Have added basic CBC, BMP labs.  Will swab for COVID/flu/RSV.  Defer any additional workup to onsite ED provider.  Patient seen and received a tele-medical screening examination in triage.  The provider performing the medical screening exam was not located at the facility and was located remotely.  Patient understands that the provider is seeing them remotely and consents to the exam.  Additionally, the patient has been advised of the risks and benefits of a video visit that differ from in-person treatment such as: a limited physical examination, unforeseen disruptions to connectivity, risks to patient confidentiality and privacy. Patient was also explained the risks of the exam which include video or sound dysfunction, or minor portions of the exam performed in conjunction with ancillary staff in the ED.  Appropriate orders have been initiated based on my brief physical exam and HPI. Patient placed in appropriate area until a treatment room becomes available for further evaluation and management by the  in-house provider.  This tele-medical screening exam was electronically signed by [name], PA on 12/10/2022 at 9:38 PM  History   Chief Complaint  Patient presents with  . Neck Pain    Pt reports neck pain, right sided headache, and ear pain since Saturday - also reports nausea and vertigo - feels like ear is full of fluid - Pt is currently being treated for sinus infection - was seen at urgent care today and given steroid shot and was sent here for scan   HPI 67 y.o. female presents with complaints if pain on the right side of her head with a feeling of fluid behind her right ear and pain in the top of the neck.  On Augmentin 875/125 for the past 8 days, improved on antibiotics and prednisone.  Reports history of seasonal allergies, not currently on any medication.  Denies using nasal steroids or saline.  During ROS reports blurry vision off and on for the past 3 to 4 months that she attributes to her allergies.  Denies fever, chills.  Able to put chin to chest without any worsening headache.  No other complaints or concerns.    Past Medical History:  Diagnosis Date  . Allergy   . Allergy history unknown   . Anemia   . Anxiety   . Cancer (*)    basal cell carcinoma  . Chronic sinusitis   . Depression   . Diverticulitis   . GERD (gastroesophageal reflux disease)   . Hyperlipidemia     Past Surgical History:  Procedure Laterality Date  . Appendectomy    .  Colonoscopy    . Tubal ligation    . Upper gastrointestinal endoscopy  2015    Social History   Substance and Sexual Activity  Alcohol Use No   Social History   Tobacco Use  Smoking Status Never  . Passive exposure: Never  Smokeless Tobacco Never   E-Cigarettes  . Vaping Use Never User   . Start Date    . Cartridges/Day    . Quit Date     Social History   Substance and Sexual Activity  Drug Use No         Allergies  Allergen Reactions  . Doxycycline Agitation    Anxiety and panic state  .  Phenergan [Promethazine] Nausea Only    Home Medications   FAMOTIDINE (PEPCID) 20 MG TABLET    Take 1 tablet by mouth twice daily   PRAVASTATIN SODIUM (PRAVACHOL) 20 MG TABLET    Take one tablet (20 mg dose) by mouth every evening.    Review of Systems   Review of Systems  Constitutional:  Negative for chills and fever.  HENT:  Positive for congestion. Negative for ear pain and sore throat.   Eyes:  Negative for pain and visual disturbance.  Respiratory:  Negative for cough and shortness of breath.   Cardiovascular:  Negative for chest pain and palpitations.  Gastrointestinal:  Positive for nausea. Negative for abdominal pain and vomiting.  Genitourinary:  Negative for dysuria and hematuria.  Musculoskeletal:  Negative for arthralgias and back pain.  Skin:  Negative for color change and rash.  Neurological:  Positive for dizziness and headaches. Negative for seizures and syncope.  All other systems reviewed and are negative.   Physical Exam   ED Triage Vitals [12/10/22 2006]  BP (!) 157/90  Heart Rate 98  Resp 18  SpO2 97 %  Temp 98.4 F (36.9 C)    Physical Exam  Nursing note and vitals reviewed. Constitutional: She appears well-developed and well-nourished. She does not appear distressed, does not appear ill and no respiratory distress. Not diaphoretic. Uncomfortable appearing, non toxic, no acute distress, no respiratory distress   HENT:  Head: Normocephalic and atraumatic.  Right Ear: Normal external ear. Normal ear canal.  Left Ear: Normal external ear.  Nose: Nose normal.  Mouth/Throat: Voice normal.  Left TM obstructed 2/2 cerum Right TM appears unremarkable  Eyes: Conjunctivae are normal. Pupils are equal, round, and reactive to light. Right eye: no conjunctival injection. Left eye: no conjunctival injection. No scleral icterus.  Neck: Normal range of motion and voice normal. Neck supple.  Cardiovascular: Normal rate, regular rhythm, normal heart sounds and  intact distal pulses.  Pulmonary/Chest: No respiratory distress. Respiratory effort normal and breath sounds normal. No visible chest trauma.  Abdominal: Soft. There is no abdominal tenderness. Abdomen not distended. No visible abdominal trauma.  Musculoskeletal: Normal range of motion. No obvious deformity noted to extremities.     Cervical back: Normal range of motion and neck supple.   Neurological: She is alert and oriented to person, place, and time. Gait normal. She has normal speech. Cranial nerves intact II through XII. Finger to nose intact bilaterally. Sensation intact to light touch, bilateral upper and lower extremities. Heel to shin intact bilaterally. Strength 5/5 bilateral upper and lower extremities.  Mental status: Awake, alert, oriented x3 Speech: Fluent Motor: 5/5  Strength throughout all extremities Sensation: Intact to light touch Gait: Intact Cranial nerves: 2-12  intact Cerebellar: Finger to nose and heel to shin intact; no ataxia  Skin: Skin is warm. Not diaphoretic. Skin is dry.  Psychiatric: She has a normal mood and affect. Her behavior is normal.    ED Course   Lab results:   CBC AND DIFFERENTIAL - Abnormal      Result Value   WBC 11.8 (*)    RBC 5.60 (*)    HGB 16.3 (*)    HCT 48.6 (*)    MCV 86.8     MCH 29.1     MCHC 33.5     Plt Ct 390     RDW SD 42.8     MPV 10.5     NRBC% 0.0     Absolute NRBC Count 0.00     NEUTROPHIL % 93.6     LYMPHOCYTE % 5.0     MONOCYTE % 0.5     Eosinophil % 0.1     BASOPHIL % 0.2     IG% 0.6     ABSOLUTE NEUTROPHIL COUNT 11.02 (*)    ABSOLUTE LYMPHOCYTE COUNT 0.59 (*)    Absolute Monocyte Count 0.06 (*)    Absolute Eosinophil Count 0.01     Absolute Basophil Count 0.02     Absolute Immature Granulocyte Count 0.07 (*)   BASIC METABOLIC PANEL - Abnormal   Na 140     Potassium 4.8     Cl 102     CO2 27     AGAP 11     Glucose 182 (*)    BUN 10     Creatinine 0.79     Comment: Suppressed on a previously  preliminary verified result   Ca 10.0     BUN/CREAT RATIO 12.7     Comment: Suppressed on a previously preliminary verified result   eGFR 82     Comment: Normal GFR (glomerular filtration rate) > 60 mL/min/1.73 meters squared, < 60 may include impaired kidney function. Calculation based on the Chronic Kidney Disease Epidemiology Collaboration (CK-EPI)equation refit without adjustment for race. Suppressed on a previously preliminary verified result  URINALYSIS W/MICRO REFLEX CULTURE - SYMPTOMATIC - Abnormal   Urine Color Yellow     Urine Clarity Clear     Urine Specific Gravity 1.014     Urine pH 7.5     Urine Protein - Dipstick Negative     Urine Glucose 70 (*)    Urine Ketones Negative     Urine Bilirubin Negative     Urine Blood Negative     Urine Nitrite Negative     Urine Urobilinogen <2     Urine Leukocyte Esterase 25 (*)    Urine Squamous Epithelial Cells 3-4 (*)    Urine WBC 0-2     UA Microscopic Yes Micro (*)    Narrative:    Does not meet criteria for reflex to Urine Culture.  COVID-19, FLU A+B AND RSV - Normal   Flu A Negative     Flu B Negative     RSV PCR Negative     SARS-COV-2 Not Detected     Narrative:    SARS-COV-2 (COVID-19)PCR-Negative results do not preclude SARS-CoV-2 infection and should not be used as the sole basis for patient management decisions. Negative results must be combined with clinical observations, patient history, and epidemiological information.  Flu and/or RSV - Negative results do not preclude the presence of Flu or RSV virus and should not be used as the sole basis for treatment or other patient management decisions. False negative results may occur if virus is  present at levels below the analytical limit of detection.  This test detects Influenza A, Influenza B, and Respiratory Syncytial Virus and SARS-COV-2 (COVID-19) by PCR.    Testing was performed using the CEPHEID SARS-CoV-2 EUA ASSAY:  The Cepheid SARS-CoV-2 EUA assay has not  been FDA cleared or approved.  It has been authorized by FDA under an Emergency Use Authorization (EUA).  The test has been authorized only for the detection of nucleic acid from SARS-CoV-2, not for any other viruses or pathogens.  It is only authorized for the duration of time the declaration that circumstances exist justifying the authorization of the emergency use of in vitro diagnostic tests for detection of SARS-CoV-2 virus and/or diagnosis of COVID-19 infection under section 564(b) (1) of the Act, 21 U.S.C 360bbb-3 (b) (1), unless the authorization is terminated or revoked sooner.   Link to Patient Fact Sheet:  https://www.moore.com/   Link to Provider Fact Sheet  https://www.young.biz/    LIGHT BLUE TOP  LAVENDER TOP  GOLD SST    Imaging:   CT HEAD WO CONTRAST   Narrative:    CT HEAD WITHOUT CONTRAST TECHNIQUE: Noncontrast axial images of the head obtained. Multiplanar reconstructions were obtained and reviewed .Dose reduction was utilized (automated exposure control, mA or kV adjustment based on patient size, or iterative image reconstruction). INDICATION: Headache   COMPARISON: 05/26/2020 FINDINGS: There is no intracranial hemorrhage, edema, mass effect of midline shift.  There is no abnormal extra-axial fluid collection.  There is no evidence for acute infarct. .  Ventricular size is appropriate for brain volume. No acute calvarial abnormality.  The paranasal sinuses and mastoid air cells demonstrate no acute abnormality.   Impression:    IMPRESSION: No acute intracranial abnormality.   Electronically Signed by: Sonny Livings, MD on 12/10/2022 10:35 PM  CT SPINE CERVICAL WO CONTRAST   Narrative:    CT CERVICAL SPINE INDICATION: PAIN TECHNIQUE: CT imaging of the cervical spine performed.  Axial images and multiplanar reconstructions were obtained and reviewed. Dose reduction was utilized (automated exposure control, mA or kV  adjustment based on patient size, or iterative image reconstruction). COMPARISON: None FINDINGS:  There is no cervical spine fracture or post-traumatic malalignment. Vertebral heights and alignment are maintained. Disc spaces are normal. Prevertebral soft tissues and paravertebral musculature are unremarkable.   Impression:    IMPRESSION:  1.  No acute abnormality.  Electronically Signed by: Sonny Livings, MD on 12/10/2022 10:36 PM  CT ANGIO HEAD NECK   Narrative:    CTA head and neck: Technique: Postcontrast imaging was obtained through the head and neck after intravenous contrast is administered utilizing a CTA protocol. MIP reconstructed sagittal and coronal images were also obtained. NASCET Criteria was utilized for evaluation of potential vascular stenosis in the neck. This exam was performed according to our departmental dose-optimization program, which includes automated exposure control, adjustment of the mA and/or KV according to the patient's size and/or use of iterative reconstruction technique. 3-D reformats were generated and reviewed on an independent workstation and saved to PACS. CONTRAST: 60 mL  [id number],  COMPARISON: CT of the head from the same day HISTORY:  67 years  old Female with persistent vertigo. FINDINGS: CTA HEAD: No proximal filling defect is seen within the middle, anterior, and posterior cerebral arteries.  The visualized intracranial vertebral arteries appear unremarkable.  The cerebral internal carotid arteries appear to be well opacified.  No obvious aneurysm or stenosis is readily apparent.  The basilar artery and visualized portions of the  cerebellar arterial circulation appear unremarkable. The dural venous vasculature demonstrates no focal filling defect. CTA NECK: There is normal three-vessel aortic arch morphology. The brachiocephalic and proximal subclavian arteries are patent and normal in course and caliber. The common carotid arteries and  proximal external carotid arteries are patent and normal in course and caliber. The cervical segments of the internal carotid arteries and carotid bulbs are patent and normal in course and caliber. There is a codominant vertebral arterial system. The vertebral arteries are patent and normal in course and caliber.   Impression:    IMPRESSION: No proximal filling defect, aneurysm, or obvious stenosis is seen within the anterior, posterior, or middle cerebral arteries. There are no findings to suggest a hemodynamically significant stenosis of the carotid arteries. No acute intracranial hemorrhage is seen. If there is persistent clinical concern for a neurologic deficit, consider MRI brain with diffusion-weighted sequences for further evaluation.   Electronically Signed by: Rock Croft, MD on 12/10/2022 11:52 PM    ECG: ECG Results   None                               Pre-Sedation Procedures    Medical Decision Making Patient's Noncon head CT, CTA head and neck are without acute abnormalities.  She does state was recently treated for sinusitis.  Her vertigo could be due to an inner ear issue.  She has been taking meclizine at home without relief.  She did have a dose of Ativan  here in the emergency department and did have improvement in her symptoms.  She was started on a course of Valium.  We will have her follow-up with ENT. I reviewed diagnosis with the patient. All questions answered. Patient is comfortable with the plan to go home and follow up as an outpatient.  Patient remains hemodynamically stable and ready for discharge.  Strict return precautions were discussed and given in writing.   Amount and/or Complexity of Data Reviewed Labs: ordered. Radiology: ordered.  Risk Prescription drug management.       Provider Communication  New Prescriptions   DIAZEPAM (VALIUM) 5 MG TABLET    Take one half tablet (2.5 mg dose) by mouth every 6 (six) hours as needed  (Vertigo) for up to 20 doses. Max Daily Amount: 10 mg      Quantity: 10 tablet    Refills: 0    Modified Medications   No medications on file    Discontinued Medications   No medications on file    Clinical Impression   Final diagnoses:  Vertigo    ED Disposition     ED Disposition  Discharge   Condition  Stable   Comment  --                 Follow-up Information     Schedule an appointment as soon as possible for a visit  with Honora KANDICE Largo, MD.   Specialties: Otology-Neurotology , Otolaryngology Contact information: [address] [place] [address] [phone]                  Electronically signed by:    Waddell PARAS Day, DO 12/11/22 [id number]

## 2023-03-21 NOTE — ED Provider Notes (Signed)
 [facility]  ED Provider Note  Jennifer Cline 67 y.o. female DOB: 1955/12/26 MRN: [id number] History   Chief Complaint  Patient presents with  . Abdominal Pain    C/o lower abd pain with nausea x 1 week HX diverticulitis , seen by UC yesterday sent here for CT    Patient is a 67 year old female with past medical history of depression, diverticulitis, GERD, hyperlipidemia, anxiety, anemia, basal cell carcinoma, Gilbert's disease presents emergency department with reports of generalized abdominal pain onset 1 week ago.  Patient denies any preceding factors.  Patient reports pain exacerbation with eating, denies any alleviating factors.  Patient reports this feels similar in nature to when she has previously had diverticulitis.  She reports associated nausea, constipation.  She describes the pain as a pressure, states that it radiates up into her chest.  Patient denies any fevers, shortness of breath, vomiting, urinary symptoms.  Reports previous abdominal surgery of appendectomy, tubal ligation.  Patient reports that her most recent colonoscopy was a little under 10 years ago and states that it was normal.       Past Medical History:  Diagnosis Date  . Allergy   . Allergy history unknown   . Anemia   . Anxiety   . Cancer (*)    basal cell carcinoma  . Chronic sinusitis   . Depression   . Diverticulitis   . GERD (gastroesophageal reflux disease)   . Hyperlipidemia     Past Surgical History:  Procedure Laterality Date  . Appendectomy    . Colonoscopy    . Tubal ligation    . Upper gastrointestinal endoscopy  2015    Social History   Substance and Sexual Activity  Alcohol Use No   Social History   Tobacco Use  Smoking Status Never  . Passive exposure: Never  Smokeless Tobacco Never   E-Cigarettes  . Vaping Use Never User   . Start Date    . Cartridges/Day    . Quit Date     Social History   Substance and Sexual Activity   Drug Use No         Allergies  Allergen Reactions  . Doxycycline Agitation    Anxiety and panic state  . Phenergan [Promethazine] Nausea Only    Discharge Medication List as of 03/21/2023  4:34 PM     CONTINUE these medications which have NOT CHANGED   Details  famotidine (PEPCID) 20 MG tablet Take 1 tablet by mouth twice daily, Normal    pravastatin sodium (PRAVACHOL) 20 mg tablet TAKE 1 TABLET(20 MG) BY MOUTH EVERY EVENING, Normal        Review of Systems   Review of Systems  Constitutional:  Negative for fever.  Respiratory:  Negative for shortness of breath.   Cardiovascular:  Negative for chest pain.  Gastrointestinal:  Positive for abdominal pain and constipation. Negative for vomiting.  Genitourinary:  Negative for dysuria and hematuria.    Physical Exam   ED Triage Vitals [03/21/23 1125]  BP (!) 169/90  Heart Rate 69  Resp 16  SpO2 100 %  Temp 98.5 F (36.9 C)    Physical Exam  Constitutional: She appears well-developed and well-nourished.  HENT:  Head: Normocephalic and atraumatic.  Right Ear: Normal external ear.  Left Ear: Normal external ear.  Nose: Nose normal.  Mouth/Throat: Voice normal.  Eyes: EOM are intact.  Neck: Normal range of motion and voice normal.  Cardiovascular: Normal rate and regular rhythm.  Pulmonary/Chest: Respiratory effort normal and breath sounds normal.  Abdominal: Soft. There is mild abdominal tenderness in the epigastric area, left upper quadrant and left lower quadrant. There is no guarding and no rebound. Abdomen not distended. Bowel sounds are normal.  Musculoskeletal: Normal range of motion.     Cervical back: Normal range of motion.     Thoracic back: No tenderness or bony tenderness.     Lumbar back: No tenderness or bony tenderness.   Neurological: She is alert and oriented to person, place, and time. She has normal speech. Sensation intact to light touch, bilateral upper and lower extremities. She has normal  strength. Sensory intact.  Skin: Skin is warm. Skin is dry.  Psychiatric: She has a normal mood and affect. Her behavior is normal.    ED Course   Lab results:   COMPREHENSIVE METABOLIC PANEL - Abnormal      Result Value   Na 143     Potassium 4.5     Cl 106     CO2 28     AGAP 9     Glucose 103 (*)    BUN 10     Creatinine 0.60     Ca 10.2     ALK PHOS 95     T Bili 2.94 (*)    Total Protein 6.3     Alb 5.0 (*)    GLOBULIN 1.3 (*)    ALBUMIN/GLOBULIN RATIO 3.8 (*)    BUN/CREAT RATIO 16.7     ALT 21     AST 25     eGFR 98     Comment: Normal GFR (glomerular filtration rate) > 60 mL/min/1.73 meters squared, < 60 may include impaired kidney function. Calculation based on the Chronic Kidney Disease Epidemiology Collaboration (CK-EPI)equation refit without adjustment for race.  URINALYSIS W/MICRO REFLEX CULTURE - SYMPTOMATIC - Abnormal   Urine Color Colorless (*)    Urine Clarity Clear     Urine Specific Gravity 1.008     Urine pH 6.0     Urine Protein - Dipstick Negative     Urine Glucose Negative     Urine Ketones Negative     Urine Bilirubin Negative     Urine Blood Negative     Urine Nitrite Negative     Urine Urobilinogen <2     Urine Leukocyte Esterase Negative     UA Microscopic No Micro     Narrative:    Does not meet criteria for reflex to Urine Culture.  MAGNESIUM  - Normal   Mg 2.6    LIPASE - Normal   Lipase 29    LACTIC ACID - Normal   Lactic Acid 0.7    GEN5 CARDIAC TROPONIN T (TNT5) BASELINE - Normal   TnT-Gen5 (0hr) 7     Comment: An elevated Troponin indicates myocardial damage. Elevated troponin may also be due to pulmonary emboli, aortic dissection, heart failure, trauma, toxins and ischemia in the setting of critical illness.   GEN5 CARDIAC TROPONIN T(TNT5) 1 HOUR - Normal   TnT-Gen5 (1hr) 7     Comment: An elevated Troponin indicates myocardial damage. Elevated troponin may also be due to pulmonary emboli, aortic dissection, heart failure,  trauma, toxins and ischemia in the setting of critical illness.   Delta 1 Hour 0    CBC AND DIFFERENTIAL   WBC 5.5     RBC 5.17     HGB 14.5     HCT 44.2     MCV  85.5     MCH 28.0     MCHC 32.8     Plt Ct 258     RDW SD 40.2     MPV 10.7     NRBC% 0.0     Absolute NRBC Count 0.00     NEUTROPHIL % 64.0     LYMPHOCYTE % 27.5     MONOCYTE % 5.6     Eosinophil % 2.2     BASOPHIL % 0.5     IG% 0.2     ABSOLUTE NEUTROPHIL COUNT 3.53     ABSOLUTE LYMPHOCYTE COUNT 1.52     Absolute Monocyte Count 0.31     Absolute Eosinophil Count 0.12     Absolute Basophil Count 0.03     Absolute Immature Granulocyte Count 0.01    LIGHT BLUE TOP  GOLD SST    Imaging:   CT ABDOMEN PELVIS W IV CONTRAST   Narrative:    TECHNIQUE: Abdomen pelvic CT with contrast. 60 cc Isovue-370 IV. Multiple contiguous axial images obtained. Comparison study 04/15/2018.  HISTORY: Weakness, abdomen pain. Nausea. Previous appendectomy.  FINDINGS: Stable small hiatal hernia. Liver, spleen, pancreas, gallbladder, adrenal glands and kidneys show no focal abnormality. Vascular structures patent. No adenopathy. Mild fecal retention.  Enlarged lobular uterus with coarse calcifications. Bladder unremarkable. No adnexal mass.  Mild depression deformity superior endplate T12, new since 04/15/2018 study.    Impression:    IMPRESSION: 1. No acute abnormality identified. 2. Mild fecal retention. 3. Enlarged lobular uterus, likely representing multiple fibroids. 4. Mild depression deformity superior endplate T12.  Electronically Signed by: Reyes People, MD on 03/21/2023 1:56 PM  US  GALLBLADDER   Narrative:    TECHNIQUE: Realtime multiplanar grayscale and limited color Doppler ultrasound of the right upper quadrant was performed. COMPARISON: None.   INDICATION: Abdominal Pain Epigastric Pain  FINDINGS:  PANCREAS:  No focal abnormalities are identified. Visualization is limited.  VASCULATURE: No abdominal  aortic aneurysm. Visualized IVC is patent. Portal vein is patent with normal flow direction.  HEPATOBILIARY: Liver: Normal.   Gallbladder: CBD: Normal. No intrahepatic biliary ductal dilatation.  RIGHT KIDNEY: Right kidney is of normal size. No hydronephrosis. No suspicious masses. Normal renal echotexture.  MISC: N./A.    Impression:    IMPRESSION: 1.  Normal right upper quadrant ultrasound.  Electronically Signed by: Lynwood Portugal on 03/21/2023 4:10 PM    ECG: ECG Results          ECG 12 lead (In process)  Result time 03/21/23 15:26:45    In process             Narrative:   Diagnosis Class Abnormal Acquisition Device D3K Systolic BP 201 Diastolic BP 86 Ventricular Rate 67 Atrial Rate 67 P-R Interval 164 QRS Duration 86 Q-T Interval 424 QTC Calculation(Bazett) 448 Calculated P Axis 66 Calculated R Axis 42 Calculated T Axis 60  Diagnosis Normal sinus rhythm Low voltage QRS Nonspecific T wave abnormality Abnormal ECG When compared with ECG of 21-Mar-2023 14:15, No significant change was found                         ECG 12 lead (In process)  Result time 03/21/23 14:35:51    In process             Narrative:   Diagnosis Class Abnormal Acquisition Device D3K Ventricular Rate 67 Atrial Rate 67 P-R Interval 150 QRS Duration 88 Q-T Interval 426 QTC Calculation(Bazett) 450 Calculated P  Axis 39 Calculated R Axis 3 Calculated T Axis 34  Diagnosis Normal sinus rhythm Low voltage QRS Cannot rule out Anterior infarct , age undetermined Abnormal ECG When compared with ECG of 26-May-2020 07:52, Nonspecific T wave abnormality no longer evident in Lateral leads                                                 Pre-Sedation Procedures    Medical Decision Making  Based on HPI and physical exam, the following diagnostics were ordered:     Orders Placed This Encounter     CT Abdomen Pelvis W IV Contrast      US  Gallbladder     CBC And Differential     Comprehensive metabolic panel     Rainbow / Extra tubes     Urinalysis W/Micro Reflex Culture - Symptomatic     Magnesium      Lipase     Lactic Acid - Yes reflex 2 hour     Gen5 Cardiac Troponin T (TnT5) Baseline Series at: baseline, 1 hour, and 3 hour; Onset of symptoms: Greater than or equal 3 hours     Gen5 Cardiac Troponin T (TnT5) 1H     Ambulatory Referral to Gastroenterology     ECG 12 lead     ECG 12 lead    Differential diagnosis include, but are not limited to; constipation, bowel obstruction, diverticulitis, colitis, pancreatitis, gastritis, gastroenteritis, IBS, cystitis, pyelonephritis, less likely ACS, mesenteric ischemia.   CMP revealed hyperbilirubinemia, patient reports that she has a history of Gilbert's disease but per chart review this is elevated from baseline.  UA unremarkable.  CBC unremarkable.  Lactic normal, very low suspicion for mesenteric ischemia.  Lipase normal.  Magnesium  normal.  Serial troponin normal and stable.  CT abdomen pelvis revealed No acute abnormality identified. 2. Mild fecal retention. 3. Enlarged lobular uterus, likely representing multiple fibroids. 4. Mild depression deformity superior endplate T12 (patient denies any back pain, no other deficits noted on physical exam, denies any red flag back pain symptoms).  RUQ u/s revealed Normal right upper quadrant ultrasound.  Discussed lab and imaging results with patient.   Patient believes that she may be due for a colonoscopy, will place ambulatory referral to gastroenterology.  Discussed importance of follow-up with gastroenterology.  Discussed importance of follow-up with primary care provider.  Discussed strict return precautions to emergency department.   Patient understands and is agreeable with plan for discharge, no further questions at this time.       Amount and/or Complexity of Data Reviewed Labs: ordered. Radiology: ordered. ECG/medicine tests:  ordered.  Risk Prescription drug management.        Provider Communication  Discharge Medication List as of 03/21/2023  4:34 PM      Discharge Medication List as of 03/21/2023  4:34 PM      Discharge Medication List as of 03/21/2023  4:34 PM      Clinical Impression Final diagnoses:  Abdominal pain, unspecified abdominal location  Constipation, unspecified constipation type  Enlarged uterus  Defect of endplate of vertebra    ED Disposition     ED Disposition  Discharge   Condition  Stable   Comment  --                 Follow-up Information     Schedule an appointment  as soon as possible for a visit  with Kip A Corrington, MD.   Specialty: Family Medicine Contact information: [phone] [address] [place] [address] [phone]         Schedule an appointment as soon as possible for a visit  with [facility].   Specialty: Gastroenterology Contact information: [address] [place]  [address] [phone]        Schedule an appointment as soon as possible for a visit  with [facility].   Specialty: Obstetrics and Gynecology Contact information: [address] [place]  [address]        Jacques Lunger, PA-C .   Specialty: Gastroenterology Comments: changes in bowel habits, abdominal pain Contact information: [address] [place] [address] [phone]                  Electronically signed by:    Connell JONELLE Perfect, PA-C 03/22/23 1025

## 2023-08-06 NOTE — Progress Notes (Signed)
 Subjective:  Jennifer Cline is a 68 y.o. female here for evaluation of sinus infection.  Pt complains of rhinorrhea, nasal congestion, and cough.  Sx have been persistent for several days (2 weeks).  No fever.  No other sick contacts.    Review of Systems - All other systems were reviewed and are negative unless stated in HPI.  Family History  Problem Relation Age of Onset  . Cancer Father 12       brain  . Hypothyroidism Mother   . Heart disease Mother        chf  . Cancer Maternal Aunt        breast  . Hypothyroidism Sister   . Breast cancer Sister   . Hypothyroidism Daughter   . Migraines Daughter   . Hypothyroidism Maternal Grandmother   . No Known Problems Brother   . Breast cancer Sister   . No Known Problems Sister   . No Known Problems Sister   . No Known Problems Brother   . Hypothyroidism Daughter   . Diabetes Neg Hx   . Colon cancer Neg Hx   . Colon polyps Neg Hx    Past Medical History:  Diagnosis Date  . Allergy   . Allergy history unknown   . Anemia   . Anxiety   . Cancer     basal cell carcinoma  . Chronic sinusitis   . Depression   . Diverticulitis   . GERD (gastroesophageal reflux disease)   . Hyperlipidemia    Past Surgical History:  Procedure Laterality Date  . Appendectomy    . Colonoscopy    . Tubal ligation    . Upper gastrointestinal endoscopy  2015   Pediatric History  Patient Parents  . Not on file   Other Topics Concern  . Not on file  Social History Narrative  . Not on file    Objective:   BP 144/71 (BP Location: Left Upper Arm, Patient Position: Sitting)   Pulse 80   Temp 96.9 F (36.1 C) (Temporal)   Resp 20   Ht 5' 2 (1.575 m)   Wt 150 lb 6.4 oz (68.2 kg)   SpO2 98%   Breastfeeding No   BMI 27.51 kg/m  Gen: Alert, oriented, non toxic, and well hydrated.  No signs of acute distress. Head: Normocephalic.  Atraumatic.  Sclera anicteric.  Sinus pressure at frontal and maxillary sinuses with percussion. Eyes:  Extraocular movements intact.  Conjunctiva clear. Ears:  Tympanic membranes clear.  Canals clear. Pharynx: No erythema or tonsillar hypertrophy Neck: Supple.  No lymphadenopathy Respiratory:  Lungs clear to auscultation.  No use of accessory muscles. Cardiovascular: Regular rate and rhythm.  No murmurs noted Abdominal:  Soft, non tender, non distended.  No hepatosplenomegally Neuro: Cranial nerves intact grossly.  No loss of strength, sensation Extremities:  Full range of motion.   Skin:  No rashes noted. Psych: Oriented, alert.  Assessment:  No diagnosis found.  Plan:   - Take all the medication prescribed. - Take nasal decongestant or otc mucinex for symptomatic relief. - Take zyrtec or claritin or allegra  to help with allergic symptoms. - Rest and stay hydrated. - Infection may take several days to weeks to resolve.   - Take cough syrup as directed. - If symptoms worsen or you have concerns, return to clinic to be re-evaluated. - I discussed this diagnosis with the patient and discussed the treatment plan with them.  This treatment plan is also outlined in the  Patient Instructions and a copy of this was provided to the patient.

## 2023-08-25 NOTE — Progress Notes (Signed)
  Medicare AWV  Jennifer Cline is a 68 y.o. female who presents for her subsequent annual wellness visit for Medicare.  Clinical documentation was reviewed and is accessible via encounter-level attachments.    Any physical exam components or additional concerns beyond the scope of the Annual Wellness Visit may be documented in a separate note within this encounter.  Medicare Required Components     Reviewed and updated this visit by provider: Tobacco  Allergies  Meds  Problems  Med Hx  Surg Hx  Fam Hx       Substance Use Disorder Risk Statement: Low Substance Use Disorder / Overdose risk - Risk factors were reviewed and patient is low risk   Patient Care Team: Kip A Corrington, MD as PCP - General (Family Medicine) Norleen GORMAN Skill as Consulting Physician (Obstetrics and Gynecology) Jacques JONELLE Lunger, PA-C (Gastroenterology)  Vitals   Vitals:   08/25/23 1347  BP: 134/78  Patient Position: Sitting  Pulse: 72  Temp: 96.9 F (36.1 C)  TempSrc: Temporal  Resp: 16  Height: 5' 2 (1.575 m)  Weight: 150 lb 6.4 oz (68.2 kg)  SpO2: 97%  BMI (Calculated): 27.5  PainSc: 0-No pain    Disposition   1. Medicare annual wellness visit, subsequent (Primary) 2. Dyslipidemia -     Lipid Panel With LDL/HDL Ratio; Future -     pravastatin sodium (PRAVACHOL) 20 mg tablet; Take one tablet (20 mg dose) by mouth every evening., Starting Mon 08/25/2023, Normal 3. Thyroid  disorder screen -     TSH Reflex T4Free/T3 Hormone; Future   Follow up in about 1 year (around 08/25/2024) for Medicare Annual Wellness Visit with fasting labs.   Health maintenance issues were discussed with the patient.  A written plan was provided to the patient in the form of patient instructions in the After Visit Summary document.

## 2023-11-11 NOTE — ED Provider Notes (Signed)
 ------------------------------------------------------------------------------- Attestation signed by Jennifer JAYSON Life, MD at 11/11/23 2320 I have reviewed and agree with the APP's findings and plan for this patient. Jennifer JAYSON Life, MD Emergency Department - 11/11/2023 11:20 PM  -------------------------------------------------------------------------------   [facility]  ED Provider Note  Jennifer Cline 68 y.o. female DOB: June 08, 1956 MRN: [id number]  Tele-Medical screening initiated and orders placed by Jennifer JAYSON Simpers, PA. 11/11/2023 / 7:40 PM  68 y.o. female presents with complaints of BLQ abd pain for the past week, but also reports her whole stomach feels like something is wrong .  She reports chills, no fever.  No nausea, vomiting, diarrhea or constipation.  Patient had an abnormally large stool yesterday without complication.  She has been taking Pepto-Bismol, Tylenol  and Maalox for her symptoms.  Reports they are reminiscent of when she has had diverticulitis in the past but she does also have upper abdominal pain that radiates to the back.  Denies chest pain. VSS, NAD, alert, answering questions appropriately.  Labs, UA, CT ordered.  Patient seen and received a tele-medical screening examination in triage.  The provider performing the medical screening exam was not located at the facility and was located remotely.  Patient understands that the provider is seeing them remotely and consents to the exam.  Appropriate orders have been initiated based on my brief physical exam and HPI. Patient placed in appropriate area until a treatment room becomes available for further evaluation and management by the in-house provider.  This tele-medical screening exam was electronically signed by Jennifer JAYSON Simpers, PA on 11/11/2023 at 7:40 PM   History   Chief Complaint  Patient presents with  . Abdominal Pain    Pt reprots abdominal pain and nausea x1+ week - Radiates  into back - pt reports unable to eat anything without feeling sick    Patient originally evaluated by [facility] provider, see above note.  68 year old female with a history of diverticulitis presented to the ER for evaluation of abdominal pain and decreased appetite.  Patient reported developing a sudden onset discomfort in her diffuse abdomen 1 week ago.  States that  the discomfort has been intermittent in nature and is worse within the right upper abdomen.  Endorses nausea and decreased appetite without vomiting.  She states that she has a history of diverticulitis, and this feels similar.  Abdominal surgery significant for appendectomy.  Denies recent dietary or medication changes.  States that she is taken Tylenol , Maalox, and Pepto-Bismol with minimal improvement.  States that solid foods seem to make symptoms worse.  No associated fevers, headaches, vision changes, chest pain, palpitations, difficulty breathing, dysuria, hematuria.       Past Medical History:  Diagnosis Date  . Allergy   . Allergy history unknown   . Anemia   . Anxiety   . Cancer (*)    basal cell carcinoma  . Chronic sinusitis   . Depression   . Diverticulitis   . GERD (gastroesophageal reflux disease)   . Hyperlipidemia     Past Surgical History:  Procedure Laterality Date  . Appendectomy    . Colonoscopy    . Tubal ligation    . Upper gastrointestinal endoscopy  2015    Social History   Substance and Sexual Activity  Alcohol Use No   Social History   Tobacco Use  Smoking Status Never  . Passive exposure: Never  Smokeless Tobacco Never   E-Cigarettes  . Vaping Use Never User   . Start  Date    . Cartridges/Day    . Quit Date     Social History   Substance and Sexual Activity  Drug Use No         Allergies  Allergen Reactions  . Doxycycline Agitation    Anxiety and panic state  . Phenergan [Promethazine] Nausea Only    Discharge Medication List as of 11/11/2023 11:03 PM      CONTINUE these medications which have NOT CHANGED   Details  famotidine (PEPCID) 20 MG tablet Take 1 tablet by mouth twice daily, Normal    pravastatin sodium (PRAVACHOL) 20 mg tablet Take one tablet (20 mg dose) by mouth every evening., Starting Mon 08/25/2023, Normal        Primary Survey   Exposure    No visible abdominal trauma.       Review of Systems   Review of Systems  Constitutional:  Positive for chills. Negative for fever.  Respiratory:  Negative for shortness of breath.   Cardiovascular:  Negative for chest pain and leg swelling.  Gastrointestinal:  Positive for abdominal pain and nausea. Negative for abdominal distention, blood in stool, constipation, diarrhea and vomiting.  Genitourinary:  Negative for dysuria and hematuria.  Musculoskeletal:  Positive for back pain.  Neurological:  Positive for weakness. Negative for dizziness and headaches.    Physical Exam   ED Triage Vitals [11/11/23 1920]  BP 150/74  Heart Rate 69  Resp 20  SpO2 97 %  Temp 98.3 F (36.8 C)    Physical Exam  Nursing note and vitals reviewed. Constitutional: She appears well-developed and well-nourished. She does not appear distressed, does not appear ill and no respiratory distress.  HENT:  Head: Normocephalic and atraumatic.  Eyes: Conjunctivae are normal.  Neck: Neck supple.  Cardiovascular: Normal rate, regular rhythm, normal heart sounds and intact distal pulses.  Pulmonary/Chest: No respiratory distress. Respiratory effort normal and breath sounds normal.  Abdominal: Soft. There is mild abdominal tenderness (most tender in RUQ) in the right upper quadrant, epigastric area, suprapubic area, left upper quadrant and left lower quadrant. There is no guarding and no rebound. Abdomen not distended. Bowel sounds are normal. There is no palpable pulsatile mass. There is no CVA tenderness. No visible abdominal trauma.  Musculoskeletal:     Cervical back: Neck supple. no  edema.  Neurological: She is alert and oriented to person, place, and time.  Skin: Skin is warm.  Psychiatric: She has a normal mood and affect. Her behavior is normal.     ED Course   Lab results:   COMPREHENSIVE METABOLIC PANEL - Abnormal      Result Value   Na 140     Potassium 4.3     Cl 104     CO2 27     AGAP 9     Glucose 106 (*)    BUN 11     Creatinine 0.66     Ca 10.1     ALK PHOS 74     T Bili 2.3 (*)    Total Protein 6.9     Alb 4.6     GLOBULIN 2.3     ALBUMIN/GLOBULIN RATIO 2.0     BUN/CREAT RATIO 16.7     ALT 17     AST 13     eGFR 96     Comment: Normal GFR (glomerular filtration rate) > 60 mL/min/1.73 meters squared, < 60 may include impaired kidney function. Calculation based on the Chronic Kidney Disease  Epidemiology Collaboration (CK-EPI)equation refit without adjustment for race.  URINALYSIS W/MICRO REFLEX CULTURE - SYMPTOMATIC - Abnormal   Urine Color Colorless (*)    Urine Clarity Clear     Urine Specific Gravity 1.006     Urine pH 6.0     Urine Protein - Dipstick Negative     Urine Glucose Negative     Urine Ketones Negative     Urine Bilirubin Negative     Urine Blood Negative     Urine Nitrite Negative     Urine Urobilinogen <2     Urine Leukocyte Esterase Negative     UA Microscopic No Micro     Narrative:    Does not meet criteria for reflex to Urine Culture.  LIPASE - Normal   Lipase 32    CBC AND DIFFERENTIAL   WBC 8.8     RBC 4.95     HGB 14.2     HCT 43.3     MCV 87.5     MCH 28.7     MCHC 32.8     Plt Ct 334     RDW SD 41.9     MPV 10.6     NRBC% 0.0     Absolute NRBC Count 0.00     NEUTROPHIL % 66.1     LYMPHOCYTE % 26.3     MONOCYTE % 6.2     Eosinophil % 0.5     BASOPHIL % 0.6     IG% 0.3     ABSOLUTE NEUTROPHIL COUNT 5.82     ABSOLUTE LYMPHOCYTE COUNT 2.32     Absolute Monocyte Count 0.55     Absolute Eosinophil Count 0.04     Absolute Basophil Count 0.05     Absolute Immature Granulocyte Count 0.03     LIGHT BLUE TOP  GOLD SST    Imaging:   CT ABDOMEN PELVIS W IV CONTRAST   Narrative:    COMPARISON: 03/21/2023   INDICATION: Abdominal Pain   TECHNIQUE:  CT ABDOMEN PELVIS W IV CONTRAST   60 mL  IOPAMIDOL 76 % IV SOLN Dose reduction was utilized (automated exposure control, mA or kV adjustment based on patient size, or iterative image reconstruction).  FINDINGS:  CT ABDOMEN PELVIS   Lower Chest: Mild bilateral basilar subsegmental atelectasis.  Liver: Mild hepatomegaly.  Gallbladder and Biliary Tree: Unremarkable  Pancreas: Unremarkable  Spleen: Unremarkable  Adrenal Glands: Unremarkable  Kidneys and Urinary Bladder: Unremarkable  Reproductive System: Enlarged multi fibroid uterus with coarse calcifications.  Vasculature: Aortoiliac atherosclerosis without aneurysm.  Lymph Nodes: Unremarkable  Peritoneum and Retroperitoneum: No free intraperitoneal fluid. No pneumoperitoneum.  No abscess.   Gastrointestinal Tract: Small hiatal hernia.  No bowel obstruction.  No evidence for acute appendicitis.  Colonic diverticulosis.  Moderate amount of colonic stool.  Abdominal and Pelvic Walls: Unremarkable  Bones: Multilevel spondylosis.    Impression:    IMPRESSION: CT ABDOMEN PELVIS  1.  No acute process identified. 2.  Chronic nonemergent findings.   Electronically Signed by: Charlie Patch, MD on 11/11/2023 10:48 PM      ECG: ECG Results   None  Pre-Sedation Procedures    Medical Decision Making 68 yo female PMH diverticulitis presents with 1 wk of generalized abdominal pain and nausea.  On exam, patient is well-appearing and in no acute distress.  She speaking in full sentences without difficulty.  Abdominal exam notable for diffuse tenderness that is worse within the right upper abdomen.  No abdominal distention noted.  Physical exam otherwise within  normal limits.  She is afebrile vital signs are stable.    Due to HPI and PE findings, concern for but not limited to diverticulitis, biliary colic, cholecystitis, pancreatitis, ureterolithiasis, gastroenteritis.   Patient's workup is overall reassuring.  CBC negative for leukocytosis or acute anemias.  CMP and lipase within normal limits.  Urinalysis negative for infection or blood.  CT abdomen pelvis negative for acute infectious or inflammatory process.  Given patient's reassuring workup and well-appearing nature, she is appropriate for outpatient management.  Though no obvious stones or evidence of acute cholecystitis on CT, concern for some potential degree of gallbladder dysfunction given location of discomfort and correlation with food.  She was discharged with a gallbladder diet handout and encouraged to avoid/limit spicy and fatty food intake.  Discussed the importance of supportive care and close follow-up.  Strict follow-up precautions were given.  Patient expressed understanding and all questions were answered.  Further instructions provided in the discharge summary.  Risk Prescription drug management.           Provider Communication  Discharge Medication List as of 11/11/2023 11:03 PM     START taking these medications   Details  dicyclomine  (BENTYL ) 20 mg tablet Take one tablet (20 mg dose) by mouth 2 (two) times daily., Starting Tue 11/11/2023, Normal    ondansetron  (ZOFRAN -ODT) 4 mg disintegrating tablet Take one tablet (4 mg dose) by mouth every 8 (eight) hours as needed for Nausea for up to 7 days., Starting Tue 11/11/2023, Until Tue 11/18/2023 at 2359, Normal        Discharge Medication List as of 11/11/2023 11:03 PM      Discharge Medication List as of 11/11/2023 11:03 PM      Clinical Impression Final diagnoses:  Generalized abdominal pain    ED Disposition     ED Disposition  Discharge   Condition  Stable   Comment  --                  Follow-up Information     Aldona LITTIE Pizza, NP. Schedule an appointment as soon as possible for a visit in 3 days.   Specialty: Family Medicine Contact information: [address] [place] [address] [phone]         [facility] Emergency Department.   Specialty: Emergency Medicine Comments: As needed, If symptoms worsen Contact information: [address] [facility] [address] [place]  [address] [phone]        [facility]. Schedule an appointment as soon as possible for a visit in 1 week.   Comments: As needed Contact information: [address] [facility] [address] [place]  [address] [phone]                 Electronically signed by:    Graeme LITTIE Patience, PA-C 11/11/23 2307

## 2023-12-14 NOTE — ED Provider Notes (Signed)
 [facility] [place]  ED Provider Note  Jennifer Cline 68 y.o. female DOB: 01-13-1956 MRN: [id number] History   Chief Complaint  Patient presents with  . Abdominal Pain    To ED with upper abdominal pain with vomiting and chills x 3 days. Had cholecystectomy on 5/29.    HPI  Patient is a female with recent cholecystectomy (5/29) presenting with 3 days of poor oral intake, persistent nausea without emesis, and new-onset gnawing, burning epigastric discomfort accompanied by intermittent chest tightness. She denies current chest pain, shortness of breath, vomiting, diarrhea, constipation, or significant urinary complaints, though she notes mild burning with urination. She reports intermittent chills, waking in night sweats, and ongoing sinus congestion with post-nasal drainage, which she feels may worsen her nausea. She also experienced a transient episode of dyspnea and near-syncope earlier this evening, which she felt resembled prior panic attacks. No fever, abdominal distension, or signs of wound infection noted. She has a history of anxiety during menopause previously managed with alprazolam and expresses concern that these symptoms may represent recurrence.  Past Medical History:  Diagnosis Date  . Allergy   . Allergy history unknown   . Anemia   . Anxiety   . Cancer (*)    basal cell carcinoma  . Chronic sinusitis   . Depression   . Diverticulitis   . GERD (gastroesophageal reflux disease)   . Hyperlipidemia     Past Surgical History:  Procedure Laterality Date  . Appendectomy    . Colonoscopy    . Tubal ligation    . Upper gastrointestinal endoscopy  2015    Social History   Substance and Sexual Activity  Alcohol Use No   Tobacco Use History[1] E-Cigarettes  . Vaping Use Never User   . Start Date    . Cartridges/Day    . Quit Date     Social History   Substance and Sexual Activity  Drug Use No         Allergies[2]  Home  Medications   FAMOTIDINE (PEPCID) 20 MG TABLET    Take 1 tablet by mouth twice daily   PRAVASTATIN SODIUM (PRAVACHOL) 20 MG TABLET    Take one tablet (20 mg dose) by mouth every evening.    Primary Survey   Exposure   No visible chest trauma.   No visible trauma noted on back exam.      Review of Systems   Review of Systems  Constitutional:  Negative for chills, diaphoresis and fever.  HENT:  Positive for congestion. Negative for sinus pressure and sore throat.   Respiratory:  Negative for cough and shortness of breath.   Cardiovascular:  Positive for chest pain.  Gastrointestinal:  Positive for abdominal pain and nausea. Negative for anal bleeding, blood in stool and vomiting.  Genitourinary:  Positive for dysuria. Negative for frequency and urgency.  Musculoskeletal:  Negative for arthralgias, back pain, neck pain and neck stiffness.  Skin:  Negative for color change.  Neurological:  Negative for dizziness, syncope and headaches.    Physical Exam   ED Triage Vitals [12/14/23 1837]  BP 142/79  Heart Rate 88  Resp 19  SpO2 99 %  Temp 97.8 F (36.6 C)    Physical Exam  Nursing note and vitals reviewed. Constitutional: She appears well-developed and well-nourished.  Patient in no acute distress. They are not toxic or ill appearing.  HENT:  Head: Normocephalic and atraumatic.  Mild tenderness over the frontal and maxillary sinuses bilaterally.  Nasal congestion noted.   Eyes: EOM are intact. Pupils are equal, round, and reactive to light.  Neck: Normal range of motion. Neck supple.  Cardiovascular: Normal rate, regular rhythm and normal heart sounds.  Pulmonary/Chest: Respiratory effort normal and breath sounds normal. No visible chest trauma.  No adventitous lung sounds. Patient not in acute respiratory distress. No pain with deep inspiration.  Abdominal: Soft. There is mild abdominal tenderness in the epigastric area. Bowel sounds are normal. There is no palpable  pulsatile mass.  Mild epigastric abdominal pain to palpation.  No guarding, rigidity, treatment tenderness to palpation of the abdomen  Musculoskeletal: Normal range of motion. No visible trauma noted on back exam.     Cervical back: Normal range of motion and neck supple.     Comments: Equal bilateral pedal pulses.   Neurological: She is alert and oriented to person, place, and time.  Skin: Skin is warm. Skin is dry.  Psychiatric: Her behavior is normal. She appears anxious.     ED Course   Lab results:   COMPREHENSIVE METABOLIC PANEL - Abnormal      Result Value   Na 139     Potassium 4.3     Cl 102     CO2 26     AGAP 11     Glucose 123 (*)    BUN 5 (*)    Creatinine 0.66     Ca 10.1     ALK PHOS 71     T Bili 2.2 (*)    Total Protein 7.3     Alb 4.7     GLOBULIN 2.6     ALBUMIN/GLOBULIN RATIO 1.8     BUN/CREAT RATIO 7.6 (*)    ALT 9     AST 15     eGFR 96     Comment: Normal GFR (glomerular filtration rate) > 60 mL/min/1.73 meters squared, < 60 may include impaired kidney function. Calculation based on the Chronic Kidney Disease Epidemiology Collaboration (CK-EPI)equation refit without adjustment for race.  URINALYSIS W/MICRO REFLEX CULTURE - SYMPTOMATIC - Abnormal   Urine Color Colorless (*)    Urine Clarity Clear     Urine Specific Gravity 1.005     Urine pH 6.0     Urine Protein - Dipstick Negative     Urine Glucose Negative     Urine Ketones Negative     Urine Bilirubin Negative     Urine Blood Negative     Urine Nitrite Negative     Urine Urobilinogen <2     Urine Leukocyte Esterase 75 (*)    Urine Squamous Epithelial Cells 0-2     Urine WBC 0-2     Urine RBC 0-2     Urine Bacteria None     UA Microscopic Yes Micro (*)    Narrative:    Does not meet criteria for reflex to Urine Culture.  COVID-19, FLU A+B AND RSV - Normal   Flu A Negative     Flu B Negative     RSV PCR Negative     SARS-COV-2 Not Detected     Narrative:    SARS-COV-2  (COVID-19)PCR-Negative results do not preclude SARS-CoV-2 infection and should not be used as the sole basis for patient management decisions. Negative results must be combined with clinical observations, patient history, and epidemiological information.  Flu and/or RSV - Negative results do not preclude the presence of Flu or RSV virus and should not be used as the sole  basis for treatment or other patient management decisions. False negative results may occur if virus is present at levels below the analytical limit of detection.  This test detects Influenza A, Influenza B, and Respiratory Syncytial Virus and SARS-COV-2 (COVID-19) by PCR.    Testing was performed using the CEPHEID SARS-CoV-2 EUA ASSAY:  The Cepheid SARS-CoV-2 EUA assay has not been FDA cleared or approved.  It has been authorized by FDA under an Emergency Use Authorization (EUA).  The test has been authorized only for the detection of nucleic acid from SARS-CoV-2, not for any other viruses or pathogens.  It is only authorized for the duration of time the declaration that circumstances exist justifying the authorization of the emergency use of in vitro diagnostic tests for detection of SARS-CoV-2 virus and/or diagnosis of COVID-19 infection under section 564(b) (1) of the Act, 21 U.S.C 360bbb-3 (b) (1), unless the authorization is terminated or revoked sooner.   Link to Patient Fact Sheet:  ElectronicsManager.it   Link to Provider Fact Sheet  SeeTennis.com.ee    LIPASE - Normal   Lipase 27    GEN5 CARDIAC TROPONIN T (TNT5) BASELINE - Normal   TnT-Gen5 (0hr) 5     Comment: Assay results < 6 ng/L and > 10,000 ng/L are reported as 5 ng/L and 10,001 ng/L respectively to reflect the absolute reportable range of TnTGen5.  An elevated Troponin indicates myocardial damage. Elevated troponin may also be due to pulmonary emboli, aortic dissection, heart failure, trauma,  toxins and ischemia in the setting of critical illness.   MAGNESIUM  - Normal   Mg 2.3    CBC AND DIFFERENTIAL   WBC 5.6     RBC 4.83     HGB 14.1     HCT 41.5     MCV 85.9     MCH 29.2     MCHC 34.0     Plt Ct 305     RDW SD 40.7     MPV 10.2     NRBC% 0.0     Absolute NRBC Count 0.00     NEUTROPHIL % 56.5     LYMPHOCYTE % 31.4     MONOCYTE % 7.3     Eosinophil % 3.4     BASOPHIL % 1.2     IG% 0.2     ABSOLUTE NEUTROPHIL COUNT 3.17     ABSOLUTE LYMPHOCYTE COUNT 1.76     Absolute Monocyte Count 0.41     Absolute Eosinophil Count 0.19     Absolute Basophil Count 0.07     Absolute Immature Granulocyte Count 0.01    LIGHT BLUE TOP  LAVENDER TOP  MINT GREEN-TOP TUBE (PST GEL/LI HEP)  GOLD SST    Imaging:   XR CHEST AP PORTABLE   Narrative:    SINGLE VIEW AP CHEST  INDICATION: Chest Pain  COMPARISON: 12/01/2023.  FINDINGS:  No focal airspace opacity. No effusion or pneumothorax evident. The cardiomediastinal silhouette is within normal limits.     Impression:    IMPRESSION:  No acute cardiopulmonary disease.  Electronically Signed by: Tinnie Rad MD on 12/14/2023 7:59 PM  CT ABDOMEN PELVIS W IV CONTRAST   Narrative:    CT abdomen and pelvis with contrast:  INDICATION: Epigastric pain  TECHNIQUE: Axial scans were performed through the abdomen and pelvis with intravenous contrast. Contrast-75 mL Isovue-370. Radiation dose reduction was utilized (automated exposure control, mA or kV adjustment based on patient size, or iterative image reconstruction).  COMPARISON: 11/11/2023  FINDINGS:   #  Lung bases: Normal. #  Liver: Normal enhancement. No focal lesions. #  Gallbladder: Cholecystectomy #  Spleen: Normal. #  Pancreas: Normal #  Adrenal glands: Normal. #  Kidneys: Normal. #  Retroperitoneum: No mass or adenopathy. #  Aorta: No significant abnormality. #  Bowel and mesentery: No bowel dilatation. No inflammatory changes seen.   The appendix is not  identified.  The colon is poorly evaluated due to the lack of oral contrast and under distention but demonstrates no gross abnormality.   Pelvis: #  No mass or adenopathy. #  No inflammatory changes seen. #  Bladder: No significant abnormality.  #  There are multiple heterogeneous partially calcified lesions in the uterus consistent with a leiomyomatous uterus.  #  There is no pelvic free fluid.  #   #    #  Bony structures: No significant abnormality.    Impression:    IMPRESSION:    No bowel obstruction. No free fluid. No inflammatory stranding.  Leiomyomatous uterus.  Nonvisualization of the appendix.   Electronically Signed by: Marinda Fleming, MD on 12/14/2023 10:16 PM      ECG: ECG Results          ECG 12 lead (In process)  Result time 12/14/23 19:38:09    In process             Narrative:   Diagnosis Class Abnormal Acquisition Device D3K Ventricular Rate 72 Atrial Rate 72 P-R Interval 146 QRS Duration 86 Q-T Interval 388 QTC Calculation(Bazett) 424 Calculated P Axis 55 Calculated R Axis 21 Calculated T Axis 68  Diagnosis Normal sinus rhythm Nonspecific T wave abnormality Abnormal ECG When compared with ECG of 01-Dec-2023 12:50, No significant change was found                            HEAR Score History: Mostly low risk features ECG: Non specific repolarisation disturbance Age: 1 or greater Risk Factors: 1 or 2 risk factors HEAR Score Total: 4                                                        Pre-Sedation Procedures  ED Course as of 12/14/23 2241  Moises J Dimas's Documentation  Sun Dec 14, 2023  2030 Patient reports anxiety and states she has taken xanax in the past for similar episodes. Will order one time dose of 0.5 mg IV ativan .    Medical Decision Making  Based on HPI and physical exam, the following diagnostics were ordered:     Orders Placed This Encounter      COVID-19, Flu A+B and RSV Nasopharyngeal Swab Nasopharyngeal Swab     XR Chest Ap Portable     CT Abdomen Pelvis W IV Contrast     CBC And Differential     Comprehensive metabolic panel     Lipase     Rainbow / Extra tubes     Urinalysis w/ Reflex Microscopic; Reflex to Culture - Symptomatic     Gen5 Cardiac Troponin T (TnT5) Baseline Series at: baseline, 1 hour, and 3 hour; Onset of symptoms: Greater than or equal 3 hours     Magnesium      Ambulatory Referral to ENT     Ambulatory Referral to [facility] for ED 72 Hr  Chest Pain F/U     NPO Effective Now     Obtain repeat ECG 12-lead STAT 30 minutes after initial ECG IF patient is experiencing new, continued or recurrent symptoms Reason: Chest Pain - Precordial Pain     ECG 12 lead   Differential diagnosis:   STEMI/non-STEMI, angina, pneumonia, pneumothorax, hemothorax congestive heart failure, COPD, asthma, Small bowel obstruction, cholangitis, pancreatitis, diverticulitis,gastroenteritis, GI bleed, sinusitis    Patient presents with 3 days of poor oral intake, nausea, and epigastric discomfort following recent cholecystectomy. Full workup including CBC, CMP, lipase, EKG, troponin, CXR, COVID/Flu/RSV panel, UA, and CT abdomen/pelvis was performed and unremarkable. No evidence of post-op complication, obstruction, or acute intra-abdominal pathology. EKG and troponin were normal; however, given her age and a HEAR score of 4, patient was referred for 72-hour cardiology follow-up. While she did report a single episode of transient dyspnea and near-syncope, she is now normoxic with normal vitals and no ongoing SOB or chest pain. Given low pretest probability and reassuring imaging and labs, PE is unlikely and no further workup warranted. On exam, she has facial sinus tenderness and mucosal edema with post-nasal drip. Given recent Augmentin use with symptom recurrence and history of chronic sinusitis, cefuroxime was selected to provide both sinus and urinary  coverage. UA showed +LE with dysuria but no bacteria or WBCs. Patient has a history of anxiety and reported subjective improvement with IV Ativan . Discharged stable with ENT, PCP, and cardiology follow-up.  My exam, findings, treatment plan has been discussed with the patient / family.  Patient / family understand and agree with the findings, diagnosis, and treatment plan.  Will discharge at this time.  Please see the  'After Visit Summary' discharge instructions for more in-depth and detailed discharge plan.  Amount and/or Complexity of Data Reviewed Labs: ordered.    Details: Reviewed, see results above. Radiology: ordered.    Details: Reviewed, see results above. ECG/medicine tests: ordered.    Details: Reviewed, see results above.  Risk OTC drugs. Prescription drug management.           Provider Communication  New Prescriptions   CEFUROXIME (CEFTIN) 500 MG TABLET    Take one tablet (500 mg dose) by mouth 2 (two) times daily for 10 days.      Quantity: 20 tablet    Refills: 0   FLUTICASONE PROPIONATE (FLONASE) 50 MCG/ACTUATION NASAL SPRAY    one spray by Both Nostrils route daily.      Quantity: 16 g    Refills: 0   ONDANSETRON  (ZOFRAN ) 4 MG TABLET    Take one tablet (4 mg dose) by mouth every 8 (eight) hours as needed for Nausea for up to 7 days.      Quantity: 20 tablet    Refills: 0    Modified Medications   No medications on file    Discontinued Medications   No medications on file    Clinical Impression Final diagnoses:  Abdominal pain, unspecified abdominal location  Atypical chest pain  Sinusitis, unspecified chronicity, unspecified location  Dysuria  Anxiety    ED Disposition     ED Disposition  Discharge   Condition  Stable   Comment  --                 Follow-up Information     Schedule an appointment as soon as possible for a visit  with Aldona LITTIE Pizza, NP.   Specialty: Family Medicine Contact information: [phone]  [place] [address] [phone]         [facility] Emergency Department.   Specialty: Emergency Medicine Comments: If symptoms worsen Contact information: [address] [facility] [place]  [address] [phone]        Garnette DELENA Centers, MD .   Specialty: Otolaryngology Comments: recurrent sinusitis Contact information: [address] [place] [address] [phone]         Ambulatory Referral to [facility] for ED 72 Hr Chest Pain F/U .   Comments: Chest pain, ACS ruled out in ED                 Electronically signed by:       [1] Social History Tobacco Use  Smoking Status Never  . Passive exposure: Never  Smokeless Tobacco Never  [2] Allergies Allergen Reactions  . Doxycycline Agitation    Anxiety and panic state  . Phenergan [Promethazine] Nausea Only   Danniel JINNY Gurney, PA-C 12/14/23 2241

## 2023-12-31 NOTE — Progress Notes (Signed)
 Subjective:   Jennifer Cline is a 68 y.o. female who presents for follow up of anxiety.  Chief Complaint  Patient presents with  . Anxiety    Patient states after having gallbladder surgery on 11/27/2023 she has had anxiety and panic attacks.   A lot of medical stress personally and with her husband here recently. A lot of the household chores and errands are now on her. Daily panic attacks at random. No aggravating or alleviating factors she can think of specifically.  Right ear pain with sinus pressure and upper teeth pain with maxillary tenderness. TM injected with slight buldge.  Review of Systems - All other systems were reviewed and are negative unless stated in HPI.  Family History  Problem Relation Age of Onset  . Cancer Father 81       brain  . Hypothyroidism Mother   . Heart disease Mother        chf  . Cancer Maternal Aunt        breast  . Hypothyroidism Sister   . Breast cancer Sister   . Hypothyroidism Daughter   . Migraines Daughter   . Hypothyroidism Maternal Grandmother   . No Known Problems Brother   . Breast cancer Sister   . No Known Problems Sister   . No Known Problems Sister   . No Known Problems Brother   . Hypothyroidism Daughter   . Diabetes Neg Hx   . Colon cancer Neg Hx   . Colon polyps Neg Hx    Past Medical History:  Diagnosis Date  . Allergy   . Allergy history unknown   . Anemia   . Anxiety   . Cancer (*)    basal cell carcinoma  . Chronic sinusitis   . Depression   . Diverticulitis   . GERD (gastroesophageal reflux disease)   . Hyperlipidemia    Past Surgical History:  Procedure Laterality Date  . Appendectomy    . Cholecystectomy  11/27/2023  . Colonoscopy    . Tubal ligation    . Upper gastrointestinal endoscopy  2015   Pediatric History  Patient Parents  . Not on file   Other Topics Concern  . Not on file  Social History Narrative  . Not on file    Objective:   BP 142/84 (BP Location: Left Upper  Arm)   Pulse 73   Temp 96.9 F (36.1 C) (Temporal)   Resp 16   Ht 5' 2 (1.575 m)   Wt 129 lb 6.4 oz (58.7 kg)   SpO2 99%   Breastfeeding No   BMI 23.67 kg/m  Gen: Alert, oriented, non toxic, and well hydrated.  No signs of acute distress or pain.SABRA HEENT: Normocephalic.  Atraumatic.  Sclera anicteric. Neck: Supple Respiratory:  Lungs clear to auscultation.  No use of accessory muscles. Cardiovascular: Regular rate and rhythm.  No murmurs noted Abdominal:  Soft, non tender, non distended.  No hepatosplenomegally Neuro: Cranial nerves intact grossly.  No loss of strength, sensation Extremities:  Full range of motion.  No cyanosis or clubbing. Skin:  No rashes noted Psych: Oriented, alert.  Mood and affect are normal.  Answers questions appropriately.  No sadness or depressive sx noted.  Assessment:   1. Non-recurrent acute suppurative otitis media of right ear without spontaneous rupture of tympanic membrane  cefdinir (OMNICEF) 300 mg capsule    2. Acute non-recurrent maxillary sinusitis  cefdinir (OMNICEF) 300 mg capsule    3. Anxiety  ALPRAZolam (XANAX) 1  mg tablet      Plan:  No orders of the defined types were placed in this encounter.   - Continue to take medications as prescribed.   - Return to clinic to be reevaluated if symptoms worsen, persist, change, or if you have any other concerns. - I discussed this diagnosis with the patient and discussed the treatment plan with them.  This treatment plan is also outlined in the Patient Instructions and a copy of this was provided to the patient.

## 2024-01-20 NOTE — Progress Notes (Signed)
 [address] Jennifer Cline [place] [address] [phone]  First Visit   Subjective   CC:     Patient presents with  . Nausea     HPI: This is a 68 year old woman who I am being asked see the request of her PCP for evaluation of unintentional weight loss and abdominal pain.  She reports that she has been experiencing symptoms for a few weeks prior to her cholecystectomy, which was performed in May.  She underwent elective cholecystectomy at that time for concern that her gallbladder was not functioning properly.  However, she reports that since her cholecystectomy she has had a very difficult time with p.o. intake.  She reports losing approximately 20 pounds unintentionally since her cholecystectomy.  I see that she was 150 pounds in February and now weighs 123 pounds.  She has had nausea with vomiting after eating, particularly solid foods.  She has tried boost to keep up her nutrition.  She reports very rare NSAID use.  She has not felt true reflux/GERD.  She chronically takes Pepcid 20 mg daily and was prescribed PPI yesterday.  She is feeling left upper quadrant pain and epigastric pain.  She also reports worsening constipation recently.  Review of Systems:  Except as stated in the HPI, all other systems reviewed and are negative.   Past Medical History:  Diagnosis Date  . Allergy   . Allergy history unknown   . Anemia   . Anxiety   . Cancer (*)    basal cell carcinoma  . Chronic sinusitis   . Depression   . Diverticulitis   . GERD (gastroesophageal reflux disease)   . Hyperlipidemia     Patient Active Problem List   Diagnosis Date Noted  . Nausea and vomiting 01/20/2024  . Weight loss 01/20/2024  . Upper abdominal pain 01/20/2024  . Abdominal pain, unspecified abdominal location 11/18/2023  . Hyperlipidemia 07/12/2021  . Acid reflux 07/12/2021  . Dyslipidemia 07/21/2020    The 10-year ASCVD risk score Verdon DC Jr., et al., 2013) is: 6% Statin recommended    .  Palpitations 06/17/2019  . Chest pain 03/13/2019  . Class 1 obesity without serious comorbidity with body mass index (BMI) of 31.0 to 31.9 in adult 01/18/2019  . 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 Diverticulitis - bouts on 6.2014 and 6.2015 12/09/2012    Overview:  Overview:  Inpatient stay June 2014. GI referral posted.  Inpatient stay June 2014. GI referral posted.    SABRA Perennial allergic rhinitis 11/01/2011    Medications: Medications Taking[1]   Allergies[2]   Past Surgical History:  Procedure Laterality Date  . Appendectomy    . Cholecystectomy  11/27/2023  . Colonoscopy    . Tubal ligation    . Upper gastrointestinal endoscopy  2015    Social History[3]  Family History  Problem Relation Age of Onset  . Cancer Father 60       brain  . Hypothyroidism Mother   . Heart disease Mother        chf  . Cancer Maternal Aunt        breast  . Hypothyroidism Sister   . Breast cancer Sister   . Hypothyroidism Daughter   . Migraines Daughter   . Hypothyroidism Maternal Grandmother   . No Known Problems Brother   . Breast cancer Sister   . No Known Problems Sister   . No Known Problems Sister   .  No Known Problems Brother   . Hypothyroidism Daughter   . Diabetes Neg Hx   . Colon cancer Neg Hx   . Colon polyps Neg Hx      Objective   BP 132/85 (BP Location: Left Upper Arm, Patient Position: Sitting)   Pulse 98   Temp 97.5 F (36.4 C) (Oral)   Ht 5' 2 (1.575 m)   Wt 123 lb (55.8 kg)   BMI 22.50 kg/m   General: well-nourished, comfortable, no acute distress Skin: no jaundice or rashes on exposed skin Head: facial muscles normal, atraumatic and normocephalic Eyes: No icterus, conjunctivae normal.  Pupils round ENMT: Mucous membranes moist, nares normal Neck: normal motion and central trachea CV: heart regular rate  Resp: respirations unlabored and symmetrical, normal effort Abdomen: soft,  surgical scars appear well-healed, positive bowel sounds, nondistended, mildly tender to palpation diffusely Musculoskeletal: normal unassisted gait, normal range of motion Psychiatric: mood, insight, and judgment appear normal. Alert and oriented 3    Assessment   1. Nausea and vomiting, unspecified vomiting type   2. Weight loss   3. Upper abdominal pain   4. Gastroesophageal reflux disease, unspecified whether esophagitis present      Plan   Discontinued Medications   No medications on file   Modified Medications   No medications on file   New Prescriptions   No medications on file    Orders Placed This Encounter  Procedures  . EGD Diagnostic      Discussion and Summary: I recommend we proceed with diagnostic EGD for further evaluation of her symptoms-unintentional weight loss, nausea/vomiting, early satiety, and abdominal pain.  We will tentatively plan on performing gastric biopsies during the procedure.     No follow-ups on file.  The above note was dictated using voice recognition software. Grammatical errors may have occurred.  Similar sounding words can inadvertently be transcribed and may not have been corrected upon review         [1] Outpatient Medications Marked as Taking for the 01/20/24 encounter (Office Visit) with Neal Sharma, MD  Medication Sig Dispense Refill  . ALPRAZolam (XANAX) 1 mg tablet Take one half tablet (0.5 mg dose) by mouth 3 (three) times a day as needed for Anxiety. Max Daily Amount: 1.5 mg 90 tablet 0  . famotidine (PEPCID) 20 MG tablet Take 1 tablet by mouth twice daily 60 tablet 0  . fluticasone propionate (FLONASE) 50 mcg/actuation nasal spray one spray by Both Nostrils route daily. 16 g 0  . omeprazole (PRILOSEC) 20 mg capsule Take one capsule (20 mg dose) by mouth daily as needed (heartburn). 30 capsule 3  . ondansetron  (ZOFRAN -ODT) 4 mg disintegrating tablet Take one tablet (4 mg dose) by mouth every 12 (twelve) hours as  needed for Nausea for up to 30 days. 30 tablet 0  . paroxetine (PAXIL) 10 mg tablet Take one tablet (10 mg dose) by mouth daily. 30 tablet 2  . pravastatin sodium (PRAVACHOL) 20 mg tablet Take one tablet (20 mg dose) by mouth every evening. 90 tablet 3  [2] Allergies Allergen Reactions  . Doxycycline Agitation    Anxiety and panic state  . Phenergan [Promethazine] Nausea Only  [3] Social History Socioeconomic History  . Marital status: Married  . Number of children: 2  Occupational History    Employer: [employer]  Tobacco Use  . Smoking status: Never    Passive exposure: Never  . Smokeless tobacco: Never  Vaping Use  . Vaping status: Never Used  Substance and Sexual Activity  . Alcohol use: No  . Drug use: No  . Sexual activity: Yes    Partners: Male

## 2024-01-21 NOTE — Progress Notes (Signed)
   Outpatient EGD completed. Please see below for a brief description of endoscopic findings, and plan of care moving forward. For a full report, please see EGD note in the media tab dated 01/21/2024.   Endoscopic findings:  Esophagus Normal esophagus.  Stomach Mucosa Normal mucosa was noted in the antrum and stomach body. Multiple cold forceps biopsies were performed for H. pylori in the antrum and stomach body.  Duodenum Additional duodenum findings Mild duodenal bulb erythema.    Plan of care moving forward:   Await pathology results continue current medications Follow-up office visit in 3-4 weeks avoid NSAIDs       Please feel free to contact me with any questions or concerns at [phone].   Thank you for allowing me to take part in the care of this patient.     Rosalynn Pottier, M.D., M.B.A.

## 2024-01-26 NOTE — ED Provider Notes (Signed)
 [facility]  ED Provider Note  Jennifer Cline 68 y.o. female DOB: 06-Dec-1955 MRN: [id number] History   Chief Complaint  Patient presents with  . Anxiety    To ED with anxiety lasting all day. Took 4 doses of her 0.5 mg xanax throughout the day without relief. Trembling in triage.    Patient presents with increasing anxiety which started last week given Xanax 0.5 mg 3 times a day states that she took 4 pills today unable to rest  Has been anxious since had gallbladder surgery in May and then loss of appetite had an EGD last week  Past medical history is otherwise unremarkable  Takes Xanax, pravastatin, Pepcid  Allergies to doxycycline, Phenergan  Smoke none  Alcohol gallbladder appendix tubal ligation       Past Medical History:  Diagnosis Date  . Allergy   . Allergy history unknown   . Anemia   . Anxiety   . Cancer (*)    basal cell carcinoma  . Chronic sinusitis   . Depression   . Diverticulitis   . GERD (gastroesophageal reflux disease)   . Hyperlipidemia     Past Surgical History:  Procedure Laterality Date  . Appendectomy    . Cholecystectomy  11/27/2023  . Colonoscopy    . Tubal ligation    . Upper gastrointestinal endoscopy  2015    Social History   Substance and Sexual Activity  Alcohol Use No   Tobacco Use History[1] E-Cigarettes  . Vaping Use Never User   . Start Date    . Cartridges/Day    . Quit Date     Social History   Substance and Sexual Activity  Drug Use No         Allergies[2]  Home Medications   ALPRAZOLAM (XANAX) 1 MG TABLET    Take one half tablet (0.5 mg dose) by mouth 3 (three) times a day as needed for Anxiety. Max Daily Amount: 1.5 mg   FAMOTIDINE (PEPCID) 20 MG TABLET    Take 1 tablet by mouth twice daily   FLUTICASONE PROPIONATE (FLONASE) 50 MCG/ACTUATION NASAL SPRAY    one spray by Both Nostrils route daily.   OMEPRAZOLE (PRILOSEC) 20 MG CAPSULE    Take one capsule (20 mg  dose) by mouth daily as needed (heartburn).   ONDANSETRON  (ZOFRAN -ODT) 4 MG DISINTEGRATING TABLET    Take one tablet (4 mg dose) by mouth every 12 (twelve) hours as needed for Nausea for up to 30 days.   PAROXETINE (PAXIL) 10 MG TABLET    Take one tablet (10 mg dose) by mouth daily.   PRAVASTATIN SODIUM (PRAVACHOL) 20 MG TABLET    Take one tablet (20 mg dose) by mouth every evening.    Primary Survey  Primary Survey  Review of Systems   Review of Systems  Constitutional:  Negative for fever.  Respiratory:  Negative for shortness of breath.   Cardiovascular:  Negative for chest pain.  Gastrointestinal:  Negative for abdominal pain.  Psychiatric/Behavioral:  The patient is nervous/anxious.   All other systems reviewed and are negative.   Physical Exam   ED Triage Vitals [01/26/24 0143]  BP 146/79  Heart Rate 83  Resp 26  SpO2 99 %  Temp 97.1 F (36.2 C)    Physical Exam  Nursing note and vitals reviewed. Constitutional: She appears well-developed.  HENT:  Head: Normocephalic.  Mouth/Throat: Voice normal.  Eyes: Pupils are equal, round, and reactive to light.  Neck:  Normal range of motion and voice normal.  Cardiovascular: Normal rate and regular rhythm.  Pulmonary/Chest: Respiratory effort normal and breath sounds normal.  Abdominal: Soft.  Musculoskeletal: Normal range of motion.     Cervical back: Normal range of motion.   Neurological: She is alert.  Psychiatric:  Anxious     ED Course   Lab results: No data to display  Imaging: No data to display    ECG: ECG Results          ECG 12 lead (In process)  Result time 01/26/24 01:59:20    In process             Narrative:   Diagnosis Class Abnormal Acquisition Device D3K Systolic BP 146 Diastolic BP 79 Ventricular Rate 87 Atrial Rate 87 P-R Interval 144 QRS Duration 84 Q-T Interval 382 QTC Calculation(Bazett) 459 Calculated P Axis 62 Calculated R Axis 24 Calculated T Axis  68  Diagnosis Normal sinus rhythm Nonspecific T wave abnormality Abnormal ECG When compared with ECG of 14-Dec-2023 19:16, No significant change was found                                                                                       Pre-Sedation Procedures    Medical Decision Making Patient had issues with anxiety after having cholecystectomy was given Xanax but took that today without relief and presented here unable to rest  Patient was given fluids and 1 mg of Ativan  with good clinical response patient is resting much more comfortably desires to be discharged  Amount and/or Complexity of Data Reviewed ECG/medicine tests: ordered.  Risk Prescription drug management.            Provider Communication  New Prescriptions   No medications on file    Modified Medications   No medications on file    Discontinued Medications   No medications on file    Clinical Impression Final diagnoses:  Anxiety reaction    ED Disposition     ED Disposition  Discharge   Condition  Stable   Comment  --                   Electronically signed by:       [1] Social History Tobacco Use  Smoking Status Never  . Passive exposure: Never  Smokeless Tobacco Never  [2] Allergies Allergen Reactions  . Doxycycline Agitation    Anxiety and panic state  . Phenergan [Promethazine] Nausea Only   Lynwood JAYSON Life, MD 01/26/24 [phone]

## 2024-01-28 ENCOUNTER — Ambulatory Visit (HOSPITAL_COMMUNITY)
Admission: EM | Admit: 2024-01-28 | Discharge: 2024-01-28 | Disposition: A | Discharge status: Home / Self Care | Payer: Medicare (Managed Care)

## 2024-01-28 DIAGNOSIS — F13930 Sedative, hypnotic or anxiolytic use, unspecified with withdrawal, uncomplicated: Secondary | ICD-10-CM

## 2024-01-28 NOTE — ED Provider Notes (Incomplete)
 [facility] Continuous Assessment Admission H&P  Date: 01/28/24 Patient Name: Jennifer Cline MRN: [id number] Chief Complaint: Anorexia   Diagnoses:  Final diagnoses:  Benzodiazepine withdrawal without complication [facility])    HPI: Jennifer Cline is a 68 year old female with a past psychiatric history of panic disorder with agoraphobia (treated with xanax), subsequent benzodiazepine dependence requiring librium  taper in 2015, Major Depressive Disorder (intolerant of Zoloft/ who presents with worsening anxiety in the setting of poor PO intake. Previous medical history includes: [facility] Spotted Fever t/w doxycycline (which prompted initial panic attacks), diverticulitis, GERD, appendicitis s/p appendectomy, anemia and Gilbert'ss disease. Patient reports anorexia after cholecystectomy 11/27/2023 and has not been able to eat. Has been seen in the emergency department four times in the past two months with a similar presentation.Of note, patient was prescribed Xanax two months ago by two different providers: Xanax 1 mg TID PRN by Christy Willson NP in [place] (60 pills) filled 6/16 for daily panic attacks at random without obvious trigger and continued by Megan Bradley FNP (90 pills), filled 7/15. Has lost approximately 30 pounds over the last 2 months. EGD 7/23 did not demonstrate gross changes, awaiting biopsies. 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  It appears the patient was given Xanax 0.5 mg 3 times daily as needed but has been having breakthrough anxiety, and has sometimes taken this medication 4 times daily. Presented to [facility] ED on 7/28 for identical presentation of anxiety which cleared on 1 mg of Ativan  and IV fluid, after which patient discharged home.  On interview, patient notes worsening anxiety and insomnia in the setting of benzodiazepine use.  Was prescribed Xanax 1 mg 3 times daily as needed for panic attacks 2 months ago.   Has been having worsening anxiety attacks including nocturnal attacks on waking up in the middle of the night.  Believes this is secondary to Xanax no longer working as well as it should.  On 7/28, patient woke and presented to the emergency department, where she received 1 mg IV Ativan  with a good clinical response and subsequently discharged.  Advised patient that she would need inpatient benzodiazepine taper and likely a gero bed.  Patient declined at this time, as she has to take care of her ailing husband, who is a complicated medical picture.  Discussed severity and dangers of current presentation -- patient cannot live like this anymore and, although she has an outpatient psychiatry appointment 8/15, does not believe that she will make it until then.  Asked if there is a medication that can help with sleep.  Current plan: Patient will return home overnight and re-present tomorrow for obs admission and with eye toward transfer to gero vs med psych bed for benzodiazepine detox. Will return home tonight to prepare husband for her absence. Advised strongly to continue current xanax regimen for now and contact nearest ED or 911 if she were to undergo serious withdrawal symptomatology in the interim. Will see again tomorrow AM.   Total Time spent with patient: 15 minutes  Musculoskeletal  Strength & Muscle Tone: {desc; muscle tone:32375} Gait & Station: {PE GAIT ED WJUO:77474} Patient leans: {Patient Leans:[id number]}  Psychiatric Specialty Exam  Presentation General Appearance: No data recorded Eye Contact:No data recorded Speech:No data recorded Speech Volume:No data recorded Handedness:No data recorded  Mood and Affect  Mood:No data recorded Affect:No data recorded  Thought Process  Thought Processes:No data recorded Descriptions of Associations:No data recorded Orientation:No data recorded  Thought Content:No data recorded   Hallucinations:No data recorded Ideas of Reference:No  data recorded Suicidal Thoughts:No data recorded Homicidal Thoughts:No data recorded  Sensorium  Memory:No data recorded Judgment:No data recorded Insight:No data recorded  Executive Functions  Concentration:No data recorded Attention Span:No data recorded Recall:No data recorded Fund of Knowledge:No data recorded Language:No data recorded  Psychomotor Activity  Psychomotor Activity:No data recorded  Assets  Assets:No data recorded  Sleep  Sleep:No data recorded  No data recorded  Physical Exam ROS  Blood pressure 135/75, pulse 86, temperature 98.3 F (36.8 C), temperature source Oral, resp. rate 16, SpO2 97%. There is no height or weight on file to calculate BMI.  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 Began to avoid leaving home because of their frequency. Patient was prescribed Xanax at that time and developed overwhelming anxiety/withdrawal symptoms.  Lost 20-30 pounds during that time (likely 2/2 depression) and sought inpatient hospitalization due to inability to function. Was hospitalized at [facility] 10/4-10/05/2014 and tapered from benzodiazepines. No previous psychiatric history before then. Intolerant of sertraline (diarrhea, brain fog) and Effexor (shaking). Previously on mirtazapine , but discontinued this medication around the same time for reasons unknown.  Benefited with benadryl 25 mg at bedtime for sleep, which helped abate nocturnal panic attacks. During that hospitalization, adherence with benadryl 25 led to increased PO intake and regulated sleep. Also saw benefit with magnesium  oxide 400 mg BID and a probiotic capsule.   Is the patient at risk to self? No  Has the patient been a risk to self in the past 6 months? No .    Has the patient been a risk to self within the distant past? No   Is the patient a risk to  others? No   Has the patient been a risk to others in the past 6 months? No   Has the patient been a risk to others within the distant past? No   Past Medical History: ***  Family History: ***  Social History: ***  Last Labs:  No visits with results within 6 Month(s) from this visit.  Latest known visit with results is:  Orders Only on 11/15/2014  Component Date Value Ref Range Status   CYTOLOGY - PAP 11/15/2014 PAP RESULT   Final    Allergies: Phenergan [promethazine hcl] and Doxycycline  Medications:  PTA Medications  Medication Sig   ALPRAZolam (XANAX) 0.5 MG tablet Take 0.5 mg by mouth 3 (three) times daily as needed for anxiety.   Calcium  Carb-Cholecalciferol (CALCIUM  600 + D PO) Take 1 tablet by mouth daily.   acetaminophen  (TYLENOL ) 500 MG tablet Take 500 mg by mouth every 6 (six) hours as needed for mild pain.   Multiple Vitamin (MULTIVITAMIN WITH MINERALS) TABS tablet Take 1 tablet by mouth daily.   esomeprazole  (NEXIUM ) 40 MG capsule Take 1 capsule (40 mg total) by mouth daily.   sucralfate  (CARAFATE ) 1 G tablet Take 1 tablet (1 g total) by mouth 4 (four) times daily -  with meals and at bedtime.   dicyclomine  (BENTYL ) 20 MG tablet Take 1 tablet (20 mg total) by mouth every 6 (six) hours.      Medical Decision Making  ***    Recommendations  [facility] MSE Recommendations:[id number]}  Tryphena Perkovich, MD 01/28/24  5:16 PM

## 2024-01-28 NOTE — Discharge Instructions (Addendum)
 Ms. Heinke,  Please represent tomorrow 7/31 AM for admission to observation. Our goal is to transfer you to a psychiatric hospital to help safely detox you off of xanax and provide a medication that will help you sleep. If you are having significant withdrawal symptoms, present to an emergency department immediately.   It was good to meet you. I'm sorry you're going through this. I will see you tomorrow.  Odis Cleveland, MD  Follow-up recommendations:  Activity:  Normal, as tolerated Diet:  Per PCP recommendation  Patient is instructed prior to discharge to: Take all medications as prescribed by her mental healthcare provider. Report any adverse effects and/or reactions from the medicines to her outpatient provider promptly. Patient has been instructed & cautioned: To not engage in alcohol and or illegal drug use while on prescription medicines.  In the event of worsening symptoms, patient is instructed to call the crisis hotline at 988, 911 and or go to the nearest ED for appropriate evaluation and treatment of symptoms. To follow-up with her primary care provider for your other medical issues, concerns and or health care needs.

## 2024-01-28 NOTE — Progress Notes (Signed)
   01/28/24 1423  [facility] Triage Screening (Walk-ins at [facility] only)  What Is the Reason for Your Visit/Call Today? Jennifer Cline 68 year old reported on Nov 27, 2023, she had gall bladder surgery and since that time she has not been able to eat or when she did eat she would get sick which has resulted in her losing weight. Reported she on the genetic medication for xanax (alprazolam .5mg ) and was told to take 3x daily but she has been taking it 4x daily because she feels 3x daily does not work. Reported she's in a consist state of feeling anxious. Report she cannot calm down, she can't rest for no more the 30 minutes. Report wakes up in a panic.Reported the medication used to work, however, when she started being unable to eat reported her anxiety has taken over. Stated she was at a 164 lbs and now she weights 123. Reported there is nothing for her to be anxious about. Reported years ago she experienced the same anxiety when she was going throught menopause. Denied SI/HI or psychosis. Reported she just want to get to an place where she can function.  How Long Has This Been Causing You Problems? 1 wk - 1 month  Have You Recently Had Any Thoughts About Hurting Yourself? No  Are You Planning to Commit Suicide/Harm Yourself At This time? No  Have you Recently Had Thoughts About Hurting Someone Sherral? No  Are You Planning To Harm Someone At This Time? No  Physical Abuse Denies  Verbal Abuse Denies  Sexual Abuse Denies  Exploitation of patient/patient's resources Denies  Self-Neglect Denies  Possible abuse reported to: Other (Comment) (no report of possible abuse)  Are you currently experiencing any auditory, visual or other hallucinations? No  Have You Used Any Alcohol or Drugs in the Past 24 Hours? No  Do you have any current medical co-morbidities that require immediate attention? No  Clinician description of patient physical appearance/behavior: Patient tearful and soft spoken. She was pleasant and  cooperative.  What Do You Feel Would Help You the Most Today? Stress Management  If access to [facility] was not available, would you have sought care in the Emergency Department? Yes  Determination of Need Routine (7 days)  Options For Referral Medication Management

## 2024-01-28 NOTE — ED Provider Notes (Signed)
 [facility] Medical Screening Exam  Patient Name: Jennifer Cline MRN: [id number] Date of Evaluation: 01/28/24 Chief Complaint:   Diagnosis:  Final diagnoses:  Benzodiazepine withdrawal without complication (HCC)    HPI: Jennifer Cline is a 68 year old female with a past psychiatric history of panic disorder with agoraphobia (treated with xanax), subsequent benzodiazepine dependence requiring librium  taper in 2015, Major Depressive Disorder (intolerant of Zoloft/ who presents with worsening anxiety in the setting of poor PO intake. Previous medical history includes: [facility] Spotted Fever t/w doxycycline (which prompted initial panic attacks), diverticulitis, GERD, appendicitis s/p appendectomy, anemia and Gilbert'ss disease. Patient reports anorexia after cholecystectomy 11/27/2023 and has not been able to eat. Has been seen in the emergency department four times in the past two months with a similar presentation.Of note, patient was prescribed Xanax two months ago by two different providers: Xanax 0.5 mg TID PRN by Christy Willson NP in [place] (60 pills) filled 6/16 for daily panic attacks at random without obvious trigger and continued by Megan Bradley FNP (90 pills), filled 7/15. Has lost approximately 30 pounds over the last 2 months. EGD 7/23 did not demonstrate gross changes, awaiting biopsies. 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  It appears the patient was given Xanax 0.5 mg 3 times daily as needed but has been having breakthrough anxiety, and has sometimes taken this medication 4 times daily. Presented to [facility] ED on 7/28 for identical presentation of anxiety which cleared on 1 mg of Ativan  and IV fluid, after which patient discharged home.   On interview, patient notes worsening anxiety and insomnia in the setting of benzodiazepine use.  Was prescribed Xanax 1 mg 3 times daily as needed for panic attacks 2  months ago.  Has been having worsening anxiety attacks including nocturnal attacks on waking up in the middle of the night.  Believes this is secondary to Xanax no longer working as well as it should.  On 7/28, patient woke and presented to the emergency department, where she received 1 mg IV Ativan  with a good clinical response and subsequently discharged.  Advised patient that she would need inpatient benzodiazepine taper and likely a gero bed.  Patient declined at this time, as she has to take care of her ailing husband, who is a complicated medical picture.  Discussed severity and dangers of current presentation -- patient cannot live like this anymore and, although she has an outpatient psychiatry appointment 8/15, does not believe that she will make it until then.  Asked if there is a medication that can help with sleep.   Current plan: Patient will return home overnight and re-present tomorrow for obs admission and with eye toward transfer to gero vs med psych bed for benzodiazepine detox. Will return home tonight to prepare husband for her absence. Advised strongly to continue current xanax regimen for now and contact nearest ED or 911 if she were to undergo serious withdrawal symptomatology in the interim. Will see again tomorrow AM. Advised magnesium  for sleep, which patient has at home.   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 Began to avoid leaving home because of their frequency. Patient was prescribed Xanax at that time and developed overwhelming anxiety/withdrawal symptoms. Lost 20-30 pounds during that time (likely 2/2 depression) and sought inpatient hospitalization due to inability to function. Was hospitalized  at [facility] 10/4-10/05/2014 and tapered from benzodiazepines. No previous psychiatric history before then. Intolerant of sertraline  (diarrhea, brain fog) and Effexor (shaking). Previously on mirtazapine , but discontinued this medication around the same time for reasons unknown. Benefited with benadryl 25 mg at bedtime for sleep, which helped abate nocturnal panic attacks. During that hospitalization, adherence with benadryl 25 led to increased PO intake and regulated sleep. Also saw benefit with magnesium  oxide 400 mg BID and a probiotic capsule.    Psychiatric Specialty Exam  Presentation  General Appearance:Casual  Eye Contact:Good  Speech:Clear and Coherent  Speech Volume:Normal  Handedness:No data recorded  Mood and Affect  Mood:Depressed; Anxious  Affect:Congruent   Thought Process  Thought Processes:Coherent; Goal Directed; Linear  Descriptions of Associations:Intact  Orientation:Full (Time, Place and Person)  Thought Content:WDL    Hallucinations:None  Ideas of Reference:No data recorded Suicidal Thoughts:No  Homicidal Thoughts:No   Sensorium  Memory:Immediate Good; Recent Good; Remote Good  Judgment:Fair  Insight:Fair   Executive Functions  Concentration:Fair  Attention Span:Fair  Recall:Fair  Fund of Knowledge:Fair  Language:Fair   Psychomotor Activity  Psychomotor Activity:Normal   Assets  Assets:No data recorded  Sleep  Sleep:Poor  Number of hours: No data recorded  Physical Exam: Physical Exam Constitutional:      General: She is not in acute distress. Pulmonary:     Effort: Pulmonary effort is normal. No respiratory distress.  Neurological:     Mental Status: She is alert.    Review of Systems  Constitutional:  Negative for chills and fever.  Gastrointestinal:  Negative for nausea.  All other systems reviewed and are negative.  Blood pressure 135/75, pulse 86, temperature 98.3 F (36.8 C), temperature source Oral, resp. rate 16, SpO2 97%. There is no height or weight on file to calculate BMI.  Musculoskeletal: Strength & Muscle Tone: within  normal limits Gait & Station: normal Patient leans: N/A   [facility] MSE Discharge Disposition for Follow up and Recommendations: Based on my evaluation the patient does not appear to have an emergency medical condition and can be discharged with resources and follow up care for re-presentation tomorrow AM.    Quantavia Frith, MD 01/28/2024, 5:37 PM

## 2024-01-29 ENCOUNTER — Ambulatory Visit: Payer: Self-pay | Admitting: Allergy

## 2024-01-30 NOTE — Care Plan (Signed)
 Pt is alert and oriented x4. Pt denies si/hi/avh. Pt affect/mood presents constricted and hypervigilant; pt expresses anxiety and being really sick detoxing like this from the Xanax. This RN educated pt on symptoms of detox and offered prn po Atarax  which pt took without issue. When writer reassessed pt she said, the medication is making me sick, I never felt like this detoxing. Pt has received her scheduled Klonopin, but is now complaining the medication is making her sicker than I've ever been before, I want to take the other medication I had before. Writer notified provider. Pt continues to be on verge of panic; provider made aware. Pt also c/o constipation not having had a BM since Monday, 7/28; writer gave prn po Senokot. Writer will continue to monitor pt.   Problem: Discharge Planning Goal: Knowledge of medical problems (What is my main problem?) Outcome: Progressing Goal: Knowledge of self care (What do I need to do when I go home?) Outcome: Progressing Goal: Knowledge of treatment plan (Why is it important for me to do this?) Outcome: Progressing Goal: Knowledge of medication management Outcome: Progressing   Problem: Cognitive-Perceptual Pattern - Impaired Goal: Knowledge of medication management Outcome: Progressing Goal: Demonstrate ability to process, synthesize internal/external stimuli Outcome: Progressing Goal: Demonstrates orientation to place, person, time, and situation Outcome: Progressing Goal: Demonstrates improvement in or resolution of altered thought process and/or sensory perception Outcome: Progressing   Problem: Coping - Ineffective, Patient/Family Goal: Demonstrate increased insight and judgement Outcome: Progressing Goal: Effective coping Outcome: Progressing   Problem: Fall Prevention Goal: Absence of falls Outcome: Progressing   Problem: Nutrition Goal: Nutritional intake, appropriate for patient status Description: Nutrition Goals: - Meet  >/= 75% of estimated needs through all resources/nutrient intake  within 7-10 days   Outcome: Progressing   Problem: Coping - Ineffective, Patient/Family Goal: Decrease in symptoms of anxiety and/or depression Outcome: Not Progressing Note: Pt continues to be extremely anxious, and on the verge of panic, Clinical research associate informed provider and gave prn po anxiolytic.  Goal: Demonstrate improved sleep, appetite, and stable affect Outcome: Not Progressing Note: Pt continues to deny having an appetite; Clinical research associate encouraged pt to finish her Ensure, but only drank half stating I just can't eat feeling like this.

## 2024-01-30 NOTE — H&P (Signed)
 [facility] Psychiatry - H&P  Date of Admission: 01/29/2024 Attending Provider:  Redell DELENA Elder, MD Code Status:  Full Code History Source:  patient, spouse/SO, and past medical records Record Review: extensive Date of birth: [birth year] Assessment   Hospital Diagnoses: Principal Problem:   Anxiety  Formulation: Jennifer Cline is a 68 y.o. female that has a previous history of alprazolam usage, and her last 2 months of usage at 0.5 mg 4 times a day has led to dependency and she is eager for detox and discontinuation of this agent however any reductions make her highly anxious and nonfunctional and she is therefore admitted for inpatient detox.  She acknowledges depression but no thoughts of harming self and can contract  Reason(s) for Admission: psychomotor dysfunction (agitation or retardation) that interferes with ADLs to such a degree that the individual cannot survive outside the hospital setting.  Based on risk assessment and clinical exam, patient is at risk for: acute suicide or self-harm risk (Low), acute homicide or violence risk (Low), acute self-care deficit risk (Low, and substance abuse risk; (High ).  Individualized risk factors include: has severe anxiety, has severe insomnia, and high risk diagnoses.  Individualized protective factors include: patient has treatable psychiatric disorders and symptoms, moving patient to higher level of care, married, positive family connectedness, feels supported, no access to firearms, optimism that change can occur, and participating in treatment plan.  Patient's strengths include: stable housing, healthcare insurance, good physical health, and flexible personality features.  Treatment Plan   - Disposition:  - continued psychiatric hospitalization justified due to requires inpatient detoxification due to risk of seizures.  - Precautions:  - seizure - Goals and Interventions:  - Group and milieu  therapy with family/outpatient support meeting as tolerated. Treatment plan focuses on modifiable risk factors and includes: level of care coordination and recommendations, risks/benefits/alternatives to treatment including common side effects and black box warnings on medication, risk factor reduction, safety goals/plan, and importance of compliance with chosen treatment. - Medications:  - Begin clonazepam taper we will begin gradually at 0.5 3 times daily also we discussed the use of Tegretol and gabapentin possibly we will also add citalopram tomorrow to help with panic but again will take gradual steps the main thing is to treat the intense anxiety now and then begin the clonazepam taper - Pertinent Labs:  - pos xanax - Consults:  - sw - Estimated date of discharge:  -  8/4  -  I certify that the patient does need, on a daily basis, active treatment furnished directly by or requiring the supervision of inpatient psychiatric facility personnel.  For inpatient care, physician will direct treatment team plan within three days of admission and at least weekly thereafter.   CC & HPI   CC:  Trying to get off Xanax   Jennifer Cline is a 68 y.o. female that has a previous history of alprazolam dependency but it 0.25 mg in the past more recently has been on 0.5 mg 3 or 4 times a day for 2 months and is craving more and feels it is never quite adequate so she knows she needs to get detoxed off of it.  It is only been 2 months so this is a wise time to get off and she is having some depression anxiety without thoughts of harming self without thoughts of harming others.  No seizures or psychosis at this point in time more recently- Gust recent gallbladder surgery  discussed husband's health issues knee surgery so forth that her stressors are a bit overwhelming at this point in time Current suicidal/homicidal ideations: Denies Current auditory/visual hallucinations: Denies  Other exam consistent  and nearly identical with the note below from the emergency department yesterday: Jennifer Cline is a 68 y.o. female with a history of anxiety who presented to [facility] for  Anxiety. Per traige notes, States she had a gallbladder procedure done in May and since then she has been struggling with anxiety. States she has been prescribed xanax and her provider wants her to be taken off of it.    Currently on interview, the patient observed in bed.  Awake and alert, oriented x4.  Cooperative, pleasant. Patient reports she presented here due to severe depression and to get help taken off Alprazolam (Xanax).  The patient reports she has been prescribed Xanax 0.5mg  three times daily for the past two months by an urgent care, but admits to taking it four times daily because she feels 3x daily does not work for her panic and anxiety. Patient states the medication is now making her more anxious and nervous, causing her to want more. She denies any current suicidal ideation or attempts. Patient reports a history of gallbladder surgery on Nov 27, 2023, which led to significant illness for three weeks post-operation. She has since struggled with eating, sleeping and experienced the onset of severe panic and anxiety attacks and now she is severely depressed. Patient expresses feeling that she is not getting better, which is contributing to her depression. She endorses  depressed mood.  Denies SI/ HI/AVH. She denies use of alcohol, tobacco, or illicit substances. Patient is currently experiencing anxiety symptoms, including shaking and nervousness, which she attributes to being in the hospital again.  Patient reports she is only prescribed Xanax  and she took her last dose today but wants help get off it. Reports she was prescribed Paxil but it made her symptoms worse so she discontinued it.    Current suicidal/homicidal ideations: Denies Current auditory/visual hallucinations: Denies Past Psychiatric History    Previous diagnoses: MDD Panic d/o GAD Previous psychiatric medication trials: paxil/xanax Past suicidal/homicidal ideation/attempt: neg Current/Past psychiatric provider: PCP Previous psychiatric hospitalizations/Rehab: 10 yrs ago x1  Past Medical History   Past Medical History:  Diagnosis Date  . Allergy   . Allergy history unknown   . Anemia   . Anxiety   . Cancer (*)    basal cell carcinoma  . Chronic sinusitis   . Depression   . Diverticulitis   . GERD (gastroesophageal reflux disease)   . Hyperlipidemia     Substance Use History (Over the past 12 months)   Marijuana: Denies Cocaine: Denies Opiates: Denies Stimulants: Denies Benzodiazepine: daily Tobacco:  Denies Alcohol: Denies Other illicit drug usage: Denies  Patient denies all other substance use except for what is listed above.  History of substance/alcohol abuse treatment: Not applicable  The patient was not counseled on the dangers of alcohol/substance use and practical counseling included: N/A  See [facility] staff evaluations & assessments for further details.  Findings to be discussed by team and integrated into treatment plan as indicated.  Tobacco Use Screening and Recommendation   Current tobacco use? Never used  The patient was not counseled on the dangers of tobacco use and practical counseling included: n/a.  Reviewed strategies to maximize success, including n/a.  FDA-approved cessation medication offered/received: N/A  Social and Family History   Trauma Hx: No issues of physical, emotional,  or sexual abuse are reported.  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 Cancer Father 60       brain  . Hypothyroidism Mother   . Heart disease Mother        chf  . Cancer Maternal Aunt        breast  .  Hypothyroidism Sister   . Breast cancer Sister   . Hypothyroidism Daughter   . Migraines Daughter   . Hypothyroidism Maternal Grandmother   . No Known Problems Brother   . Breast cancer Sister   . No Known Problems Sister   . No Known Problems Sister   . No Known Problems Brother   . Hypothyroidism Daughter   . Diabetes Neg Hx   . Colon cancer Neg Hx   . Colon polyps Neg Hx     Evaluation   Medications: . [START ON 01/31/2024] citalopram hydrobromide  10 mg Oral Daily  . clonazePAM  0.5 mg Oral TID  . famotidine  20 mg Oral BID  . magnesium  oxide  400 mg Oral QPM  . mirtazapine   7.5 mg Oral HS  . pravastatin sodium  20 mg Oral QPM   acetaminophen , aluminum & magnesium  hydroxide-simethicone , benztropine **OR** benztropine mesylate, haloperidol **OR** haloperidol lactate, [Held by Provider] hydrOXYzine  HCl, ondansetron  **OR** ondansetron , sennosides-docusate sodium, traZODone Allergies: Allergies[2] Vitals:  Vitals:   01/30/24 0717  BP: (!) 165/77  Pulse: 82  Resp:   Temp: 98.3 F (36.8 C)  SpO2: 97%     All labs obtained and reviewed from the current EHR through [facility] laboratories, unless otherwise stated: Lab Results  Component Value Date   WBC 5.7 01/29/2024   HGB 13.0 01/29/2024   HCT 40.5 01/29/2024   Plt Ct 264 01/29/2024   Cholesterol, Total 148 08/25/2023   Triglycerides 191 (H) 08/25/2023   HDL 50 08/25/2023   LDL 66 08/25/2023   ALT 15 01/29/2024   AST 14 01/29/2024   Na 140 01/29/2024   Potassium 3.8 01/29/2024   Cl 104 01/29/2024   Creatinine 0.50 (L) 01/29/2024   BUN 4 (L) 01/29/2024   CO2 26 01/29/2024   TSH 0.60 01/30/2024   Glucose 104 (H) 01/29/2024   Hemoglobin A1c 5.2 01/30/2024   No results found for: PHENYTOIN, PHENOBARB, VALPROATE, CBMZ, LITHIUM Lab Results  Component Value Date   Vitamin B-12 595 04/03/2014   Folate >19.9 04/03/2014   RPR Non Reactive 04/03/2014   Hep A IgM Negative 12/03/2012   Recent Results  (from the past week)  Urine Drugs of Abuse Screen   Collection Time: 01/29/24 10:39 AM  Result Value Ref Range   Ur PH DOA Scr 6.4 5.0 - 8.0   Amphet Scr Negative Negative   Barb Scr Negative Negative   Benzo Scr Positive (A) Negative   Cannab Scr Negative Negative   Cocaine Scr Negative Negative   Opiates Scr Negative Negative   Meth Scr Negative Negative   Oxyco Scr Negative Negative   Fentanyl Scr Negative Negative   Buprenorphine Screen Negative Negative    Reviewed Last EKG on File ECG 12 lead Result Date: 01/29/2024 Diagnosis Class Abnormal Acquisition Device D3K Ventricular Rate 68 Atrial Rate 68 P-R Interval 144 QRS Duration 84 Q-T Interval 410 QTC Calculation(Bazett) 435 Calculated P Axis 57 Calculated R Axis 65 Calculated  T Axis 64 Diagnosis Normal sinus rhythm Nonspecific T wave abnormality Abnormal ECG When compared with ECG of 26-Jan-2024 01:41, No significant change was found Dane Katz ([id number]) on 01/29/2024 12:24:59 PM certifies that he/she has reviewed the ECG tracing and confirms the  independent interpretation is correct.  ECG 12 lead Result Date: 01/26/2024 Diagnosis Class Abnormal Acquisition Device D3K Systolic BP 146 Diastolic BP 79 Ventricular Rate 87 Atrial Rate 87 P-R Interval 144 QRS Duration 84 Q-T Interval 382 QTC Calculation(Bazett) 459 Calculated P Axis 62 Calculated R Axis 24 Calculated T Axis 68 Diagnosis Normal sinus rhythm , with basline artifact, consider repeat EKG Nonspecific T wave abnormality Abnormal ECG When compared with ECG of 14-Dec-2023 19:16, No significant change was found Badal, Madan ([id number]) on 01/26/2024 9:58:45 AM certifies that he/she has reviewed the ECG tracing and confirms the independent  interpretation is correct.  ECG 12 lead Result Date: 12/15/2023 Diagnosis Class Abnormal Acquisition Device D3K Ventricular Rate 72 Atrial Rate 72 P-R Interval 146 QRS Duration 86 Q-T Interval 388 QTC Calculation(Bazett) 424 Calculated P Axis 55  Calculated R Axis 21 Calculated T Axis 68 Diagnosis Normal sinus rhythm Nonspecific T wave abnormality Abnormal ECG When compared with ECG of 01-Dec-2023 12:50, No significant change was found Nadene Fallow ([id number]) on 12/15/2023 4:52:11 AM certifies that he/she has reviewed the ECG tracing and confirms the independent  interpretation is correct.  ECG 12 lead Result Date: 12/02/2023 Diagnosis Class Borderline Abnormal Acquisition Device D3K Systolic BP 149 Diastolic BP 67 Ventricular Rate 79 Atrial Rate 79 P-R Interval 142 QRS Duration 88 Q-T Interval 374 QTC Calculation(Bazett) 428 Calculated P Axis 61 Calculated R Axis 40 Calculated T Axis 84 Diagnosis Normal sinus rhythm Low voltage QRS Borderline ECG When compared with ECG of 21-Mar-2023 15:25, Nonspecific T wave abnormality now evident in Lateral leads Badal, Madan ([id number]) on 12/02/2023 6:20:13 PM certifies that he/she has reviewed the ECG tracing and confirms the independent  interpretation is correct.  ECG 12 lead Result Date: 03/23/2023 Diagnosis Class Abnormal Acquisition Device D3K Systolic BP 201 Diastolic BP 86 Ventricular Rate 67 Atrial Rate 67 P-R Interval 164 QRS Duration 86 Q-T Interval 424 QTC Calculation(Bazett) 448 Calculated P Axis 66 Calculated R Axis 42 Calculated T Axis 60 Diagnosis Normal sinus rhythm Low voltage QRS Nonspecific T wave abnormality Abnormal ECG When compared with ECG of 21-Mar-2023 14:15, No significant change was found Badal, Madan ([id number]) on 03/23/2023 10:09:56 AM certifies that he/she has reviewed the ECG tracing and confirms the independent  interpretation is correct.  ECG 12 lead Result Date: 03/23/2023 Diagnosis Class Abnormal Acquisition Device D3K Ventricular Rate 67 Atrial Rate 67 P-R Interval 150 QRS Duration 88 Q-T Interval 426 QTC Calculation(Bazett) 450 Calculated P Axis 39 Calculated R Axis 3 Calculated T Axis 34 Diagnosis Normal sinus rhythm Low voltage QRS Cannot rule out Anterior infarct , age undetermined  Abnormal ECG When compared with ECG of 26-May-2020 07:52, Nonspecific T wave abnormality no longer evident in Lateral leads Badal, Madan ([id number]) on 03/23/2023 10:09:49 AM certifies that he/she has reviewed the ECG tracing and confirms the independent  interpretation is correct.  Imaging on File (last 24 hours) No results found.  Metabolic Screening: Team to review results with patient prior to discharge as applicable (e.g. if patient on antipsychotics at discharge) Metabolic screening labs have been completed within the past 12 months and results are below.  BMI: Estimated body mass index is 22.86 kg/m as calculated from the following:   Height as of 01/26/24:  5' 2 (1.575 m).   Weight as of an earlier encounter on 01/29/24: 125 lb (56.7 kg). Hemoglobin A1c  Date Value Ref Range Status  01/30/2024 5.2 4.8 - 5.6 % Final   Cholesterol, Total  Date Value Ref Range Status  08/25/2023 148 100 - 199 mg/dL Final   Triglycerides  Date Value Ref Range Status  08/25/2023 191 (H) 0 - 149 mg/dL Final   HDL  Date Value Ref Range Status  08/25/2023 50 >39 mg/dL Final   LDL  Date Value Ref Range Status  08/25/2023 66 0 - 99 mg/dL Final   VLDL Cholesterol Cal  Date Value Ref Range Status  08/25/2023 32 5 - 40 mg/dL Final   TSH  Date Value Ref Range Status  01/30/2024 0.60 0.45 - 4.50 mcIU/mL Final  08/25/2023 1.730 0.450 - 4.50 uIU/mL Final     Review Of Systems: A complete review of systems of the following systems was conducted (Constitutional, Psychiatric, Neurological, Musculoskeletal, Eyes, Gastrointestinal, Cardiovascular, Respiratory, Skin, and Endocrine). All reviewed systems are negative except pertinent positives identified in the HPI.  Physical Exam: General appearance - oriented to person, place, and time  Mental Status Evaluation   Constitutional:   General Appearance Wearing hospital scrubs and normal appearance   General Behavior Pleasant and cooperative   Musculoskeletal:   Gait and Station No gait abnormalities   Strength and tone Normal  Psychiatric:   Psychomotor Activity No motor abnormalities  Speech  Soft  Mood  Anxious and Concerned  Affect  Restricted  Thought Process Linear, logical, and goal directed  Associations Intact association  Thought Content/Perceptual Disturbances Patient denies suicidal/homicidal ideation; No evidence of auditory/visual hallucinations or delusions;  Cognition/Sensorium  AAOx4; Memory, attention, language, and fund of knowledge intact   Insight  Good  Judgment Good   Measurement Based Care Review   Independently review/perform testing/interpretation:  MADRS 22 - highly anxious  Current PHQ2(9)/CIWA/AUDIT/COWS       Measurement Based Care Review: PHQ9  More data exists      01/29/2024 01/26/2024 12/14/2023 12/01/2023 11/27/2023  Depression Screen  Wish to be Dead: No No No No No No  Suicidal Thoughts: No No No No No No  Suicide Behavior Question: No No No No No No  C-SSRS Screening Result No Risk No Risk No Risk No Risk No Risk No Risk    Details       Multiple values from one day are sorted in reverse-chronological order        GAD7 Review       12/31/2023 08/31/2018  Generalized Anxiety Disorder 7 item (GAD-7)  1. Feeling nervous, anxious, or on the edge 3 1  2. Not being able to stop or control worrying 3 0  3. Worrying too much about different things 3 3  4. Trouble relaxing 3 0  5. Being so restless that it's hard to sit still 3 0  6. Being easily annoyed or irritable 0 0  7. Feeling afraid as if something awful might happen 3 1  Total Score 18 5  Interpretation:  Severe, very likely to have an anxiety disorder  Mild, consider retesting at follow up appointment  If you checked off any problems, how difficult have these made it for you to do your work, take care of things at home, or get along with other people? 2 -    Electronically signed by: Redell DELENA Elder, MD 01/30/2024 10:26  AM          [  1] Social History Socioeconomic History  . Marital status: Married  . Number of children: 2  Occupational History    Employer: [employer]  Tobacco Use  . Smoking status: Never    Passive exposure: Never  . Smokeless tobacco: Never  Vaping Use  . Vaping status: Never Used  Substance and Sexual Activity  . Alcohol use: No  . Drug use: No  . Sexual activity: Yes    Partners: Male  [2] Allergies Allergen Reactions  . Doxycycline Agitation    Anxiety and panic state  . Phenergan [Promethazine] Nausea Only

## 2024-01-31 NOTE — Progress Notes (Signed)
 [facility] -  Inpatient Progress Note Date of Service: 01/31/2024 Length of stay: Hospital Day: 3 Assessment   Hospital Diagnoses: Principal Problem:   Anxiety Alprazolam dependency need for detox  Formulation: Jennifer Cline is a 68 y.o. female that has a previous history of alprazolam usage, and her last 2 months of usage at 0.5 mg 4 times a day has led to dependency and she is eager for detox and discontinuation of this agent however any reductions make her highly anxious and nonfunctional and she is therefore admitted for inpatient detox.  She acknowledges depression but no thoughts of harming self and can contract   Reason(s) for Admission: psychomotor dysfunction (agitation or retardation) that interferes with ADLs to such a degree that the individual cannot survive outside the hospital setting.   Based on risk assessment and clinical exam, patient is at risk for: acute suicide or self-harm risk (Low), acute homicide or violence risk (Low), acute self-care deficit risk (Low, and substance abuse risk; (High ).  Individualized risk factors include: has severe anxiety, has severe insomnia, and high risk diagnoses.  Individualized protective factors include: patient has treatable psychiatric disorders and symptoms, moving patient to higher level of care, married, positive family connectedness, feels supported, no access to firearms, optimism that change can occur, and participating in treatment plan.   Patient's strengths include: stable housing, healthcare insurance, good physical health, and flexible personality features. Need for continued hospitalization: At risk for seizures  Treatment Plan   - Disposition:  - continued psychiatric hospitalization justified due to high probability of danger if discharged with imminent rehospitalization likely - Commitment Status: Voluntary - Estimated date of discharge:  - 8/3 -  Precautions:  - q15 - Pertinent Labs:  - no acute issues - Psych Meds:  - Continue clonazepam taper see below add gabapentin to help with withdrawal symptoms - Medical Recs:  - Detox progressing  Chief Complaint   Patient requesting to go home today or tomorrow patient requesting stool softener  Interval History   Patient was seen today for re-evaluation.  Nursing reports that the alprazolam makes her nervous and the clonazepam makes her nervous so these are clearly somatic reactions because there are medications given specifically for anxiety once she has been dependent on again just taking medication makes her anxious.  The patient reports no issues with performing ADLs.  Patient has been medication compliant.  The patient reports no side effects from medications.  Current symptoms being addressed include:  addiction, withdrawal, cravings, depression, and anxiety.  Since last assessment, patient reports symptoms have stayed the same.    On assessment 8/2: As above patient is very anxious when she is taking the detox meds stating the pills themselves make her sick and anxious which of course is more of an anxiety reaction than a true reaction from benzodiazepines She denies wanting to harm her self and asks if she is on an antidepressant does not believe she needs one-discussed how this may lower her anxiety however she wants to hold off for now and focus on the detox measures which is understandable Denies suicidal thoughts and homicidal thoughts No psychosis and no seizure activity Participates in some reassurance and cognitive based therapy  Current suicidal/homicidal ideations: Denies Current auditory/visual hallucinations: Denies  Review Of Systems: Requesting stool softener A complete review of systems of the following systems was conducted (Constitutional, Psychiatric, Neurological, Musculoskeletal, Eyes, Gastrointestinal, Cardiovascular, Respiratory, Skin, and Endocrine).  All reviewed systems are  negative except pertinent positives identified in the HPI.  Documented Sleep Last 24 Hours (hours): 8 Last BM Date: 01/26/24 (per pt)   Past History   Past Medical History, Past Surgery History, Allergies, Social History, and Family History were reviewed and updated as appropriate.    Social History:  Social History[1]  Evaluation   Vitals:  Vitals:   01/31/24 0734  BP: (!) 109/55  Pulse: 74  Resp:   Temp: 97.9 F (36.6 C)  SpO2: 96%    Medications: . citalopram hydrobromide  10 mg Oral Daily  . clonazePAM  0.5 mg Oral TID  . docusate sodium  100 mg Oral BID  . famotidine  20 mg Oral BID  . gabapentin  100 mg Oral TID  . magnesium  oxide  400 mg Oral QPM  . mirtazapine   7.5 mg Oral HS  . pravastatin sodium  20 mg Oral QPM   acetaminophen , aluminum & magnesium  hydroxide-simethicone , benztropine **OR** benztropine mesylate, haloperidol **OR** haloperidol lactate, [Held by Provider] hydrOXYzine  HCl, ondansetron  **OR** ondansetron , sennosides-docusate sodium, traZODone  Labs: Recent Results (from the past 72 hours)  CBC And Differential   Collection Time: 01/29/24 10:39 AM  Result Value Ref Range   WBC 5.7 4.0 - 10.5 thou/mcL   RBC 4.53 3.93 - 5.22 million/mcL   HGB 13.0 11.2 - 15.7 gm/dL   HCT 59.4 65.8 - 55.0 %   MCV 89.4 79.4 - 94.8 fL   MCH 28.7 25.6 - 32.2 pg   MCHC 32.1 (L) 32.2 - 35.5 gm/dL   Plt Ct 735 849 - 599 thou/mcL   RDW SD 44.3 35.1 - 46.3 fL   MPV 10.8 9.4 - 12.4 fL   NRBC% 0.0 0.0 - 0.2 /100WBC   Absolute NRBC Count 0.00 0.00 - 0.01 thou/mcL   NEUTROPHIL % 66.8 %   LYMPHOCYTE % 23.6 %   MONOCYTE % 7.7 %   Eosinophil % 0.9 %   BASOPHIL % 0.7 %   IG% 0.3 %   ABSOLUTE NEUTROPHIL COUNT 3.82 1.50 - 7.50 thou/mcL   ABSOLUTE LYMPHOCYTE COUNT 1.35 1.00 - 4.50 thou/mcL   Absolute Monocyte Count 0.44 0.10 - 0.80 thou/mcL   Absolute Eosinophil Count 0.05 0.00 - 0.50 thou/mcL   Absolute Basophil Count 0.04 0.00 - 0.20 thou/mcL    Absolute Immature Granulocyte Count 0.02 0.00 - 0.03 thou/mcL   Comprehensive Metabolic Panel   Collection Time: 01/29/24 10:39 AM  Result Value Ref Range   Na 140 136 - 146 mmol/L   Potassium 3.8 3.7 - 5.4 mmol/L   Cl 104 97 - 108 mmol/L   CO2 26 20 - 32 mmol/L   AGAP 10 7 - 16 mmol/L   Glucose 104 (H) 65 - 99 mg/dL   BUN 4 (L) 8 - 27 mg/dL   Creatinine 9.49 (L) 9.42 - 1.00 mg/dL   Ca 9.5 8.6 - 89.7 mg/dL   ALK PHOS 48 25 - 834 U/L   T Bili 2.8 (H) 0.0 - 1.2 mg/dL   Total Protein 6.2 6.0 - 8.5 gm/dL   Alb 4.3 3.6 - 4.8 gm/dL   GLOBULIN 1.9 1.5 - 4.5 gm/dL   ALBUMIN/GLOBULIN RATIO 2.3 1.1 - 2.5   BUN/CREAT RATIO 8.0 (L) 11.0 - 26.0   ALT 15 0 - 40 U/L   AST 14 0 - 40 U/L   eGFR 102 >=60 mL/min/1.89m2  Ethanol   Collection Time: 01/29/24 10:39 AM  Result Value Ref Range   Ethanol <10 0 mg/dL  Urine Drugs of Abuse Screen   Collection Time: 01/29/24 10:39 AM  Result Value Ref Range   Ur PH DOA Scr 6.4 5.0 - 8.0   Amphet Scr Negative Negative   Barb Scr Negative Negative   Benzo Scr Positive (A) Negative   Cannab Scr Negative Negative   Cocaine Scr Negative Negative   Opiates Scr Negative Negative   Meth Scr Negative Negative   Oxyco Scr Negative Negative   Fentanyl Scr Negative Negative   Buprenorphine Screen Negative Negative  Urinalysis W/Micro Reflex Culture - Symptomatic   Collection Time: 01/29/24 10:39 AM  Result Value Ref Range   Urine Color Colorless (A) Yellow    Urine Clarity Clear Clear   Urine Specific Gravity 1.002 (L) 1.005 - 1.030   Urine pH 6.5 5 to 9   Urine Protein - Dipstick Negative Negative mg/dl   Urine Glucose Negative Negative mg/dL   Urine Ketones 10 (A) Negative mg/dl   Urine Bilirubin Negative Negative mg/dL   Urine Blood Negative Negative mg/dL   Urine Urobilinogen <2 <2 mg/dl   Urine Nitrite Negative Negative   Urine Leukocyte Esterase Negative Negative Leu/mcL   UA Microscopic No Micro No Micro  Hemoglobin A1c   Collection Time:  01/30/24  6:52 AM  Result Value Ref Range   Hemoglobin A1c 5.2 4.8 - 5.6 %  TSH   Collection Time: 01/30/24  6:52 AM  Result Value Ref Range   TSH 0.60 0.45 - 4.50 mcIU/mL  Free T4   Collection Time: 01/30/24  6:52 AM  Result Value Ref Range   Free T4 1.23 0.82 - 1.77 ng/dL    Imaging Results: No results found.  No results found for this visit on 01/29/24.  Anticonvulsant Drug Levels: No results found for this or any previous visit (from the past 72 weeks).  Mental Status Evaluation   Constitutional:   General Appearance Casually dressed   General Behavior Pleasant and cooperative  Musculoskeletal:   Gait and Station No gait abnormalities   Strength and tone Normal  Psychiatric:   Psychomotor Activity No motor abnormalities  Speech  Soft  Mood  Anxious  Affect  Restricted  Thought Process Linear, logical, and goal directed  Associations Intact association  Thought Content/Perceptual Disturbances Patient denies suicidal/homicidal ideation; No evidence of auditory/visual hallucinations or delusions;  Cognition/Sensorium  AAOx4; Memory, attention, language, and fund of knowledge intact   Insight  Good  Judgment Good   Electronically signed by: Redell DELENA Elder, MD 01/31/2024 11:07 AM          [1] Social History Socioeconomic History  . Marital status: Married  . Number of children: 2  Occupational History    Employer: [employer]  Tobacco Use  . Smoking status: Never    Passive exposure: Never  . Smokeless tobacco: Never  Vaping Use  . Vaping status: Never Used  Substance and Sexual Activity  . Alcohol use: No  . Drug use: No  . Sexual activity: Yes    Partners: Male

## 2024-02-01 NOTE — Discharge Summary (Signed)
 [facility]  Discharge Note   Discharge Details  Admit date:         01/29/2024 Discharge date:         02/01/2024  Hospital Days:    2 days  Active Hospital Problems   Diagnosis Date Noted POA  . *Anxiety 01/29/2024 Yes    Resolved Hospital Problems  No resolved problems to display.     Medication List     START taking these medications      Instructions  clonazePAM 0.25 mg partial tablet For: Feeling Anxious Commonly known as: KLONOPIN    2 tabs twice a day today [had first dose in hosp] 2 tabs three times a day on 8/4, One tab 4 x a day 8/5 and 8/6 One tab 3 x a day 8/7 and 8/8 and 8/9 Then One tab 2 x a day until gone   gabapentin 100 mg capsule Dose: 100 mg For: Panic Disorder Commonly known as: NEURONTIN    100 mg, Oral, 3 times a day       CONTINUE taking these medications      Instructions  famotidine 20 mg tablet For: Gastroesophageal Reflux Disease, Heartburn Commonly known as: PEPCID    Take 1 tablet by mouth twice daily   magnesium  oxide 400 Tabs Dose: 400 mg For: Disorder with Low Magnesium  Levels Commonly known as: MAG-OX    400 mg, Every evening   pravastatin sodium 20 mg tablet Dose: 20 mg For: High Amount of Fats in the Blood Commonly known as: PRAVACHOL    20 mg, Oral, Every evening       STOP taking these medications    ondansetron  4 mg disintegrating tablet Commonly known as: ZOFRAN -ODT          Hospital Course  Attending Physician:       Redell DELENA Elder, MD Consulting Services:        sw Indication for Admission:         Formulation: Jennifer Cline is a 68 y.o. female that has a previous history of alprazolam usage, and her last 2 months of usage at 0.5 mg 4 times a day has led to dependency and she is eager for detox and discontinuation of this agent however any reductions make her highly anxious and nonfunctional and she is therefore admitted for inpatient detox.  She acknowledges  depression but no thoughts of harming self and can contract  Hospital Course:       As discussed the patient presented for detox off of alprazolam she had been taking it for 2 months and could not stop on her own and felt the urge to continually escalate dosing She was always pleasant and cooperative initially had a great deal of anxiety in fact taking alprazolam and even taking clonazepam made her more nervous which is obviously a function of her anxiety about taking meds and being detox But with such enough reassurance that this improved with enough cognitive therapy again she improved she had no panic while here but had generalized anxiety as discussed that also did improve Clonazepam taper was continued and will continue at discharge based on notes below see the prescription that was given Neurontin should stay on board for at least 2 months At the point of discharge alert oriented pleasant affect shows a little more range No thoughts of harming self or others contracting fully No seizure activity no psychosis  Bedside Procedures   No orders found  [facility] Withdrawal Assessment of Alcohol, Revised Heart Rate: 98 (02/01/24 0854) BP: 121/82 (02/01/24 0854)    Social History  Social History[1]  Access Screening  BH Access Screening Release Signed: No Court Appointed Guardian: No Outside help or community services at home: None  Condition upon Discharge  BP 121/82 (BP Location: Left Upper Arm, Patient Position: Sitting)   Pulse 98   Temp 98 F (36.7 C) (Oral)   Resp 16   Ht 5' 2 (1.575 m)   Wt 125 lb (56.7 kg)   SpO2 99%   BMI 22.86 kg/m  Body mass index is 22.86 kg/m.   General: Casually dressed , well-nourished, well-developed, eye contact is good Speech: Soft Language: adequate/intact Mood: Euthymic Affect: Slightly restricted Thought Process: rate is within normal limits, content is normal, abstract reasoning & computation is within normal  limits Associations: Linear, logical, and goal directed Abnormal/Psychotic Thoughts: No evidence of delusions or hallucinations, denies suicidal ideations with No specific plan to harm self, denies homicidal ideations Orientation to person, place, time: oriented to person, place, time/date, situation, day of week, month of year, and year Attention Span & Concentration: attention span and concentration were age appropriate Recent & Remote Memory: adequate/intact Fund of Knowledge: adequate/intact Judgment: age appropriate Insight: age appropriate Risk Assessment: acute suicide or self-harm risk (Low), acute homicide or violence risk (Low), acute self-care deficit risk (Low, and substance abuse risk; (Low)  Discharge Suicide Risk Assessment: Suicide Assessment for Discharge Non-modifiable risk factors for suicide: Older Age, History of a past or current mental health disorder, particularly a depressive disorder, severe anxiety or eating disorder Suicide risk protective factors: Safety plan in place for discharge, Adequate coping skills, Relationship with the treatment team, Parenthood, Presence of support, Reason for living, Religious or spiritual beliefs against suicide, Fear of death or suicide Interventions to improve safety on discharge: It was confirmed with family or close contact that the patient does not have immediate access to a gun at discharge, Provided the patient information on emergency resources in the community, Addressed housing problems if pertinent, Ensured access to medications on discharge, Safely managed detoxification off substances and provided a referral to appropriate substance use treatment in the community, Assessed for appropriate resolution of psychosis, Assessed for appropriate resolution of the patient's suicidal intent and/or plan, Assessed that the patient's current mental health condition is stabilized well enough to transition to community based care, Follow-up care  scheduled within a week of discharge for continued care  [facility]      Appointments which have been scheduled    Feb 13, 2024 10:30 AM New Patient with Charlie Dallas Salines, DO [facility]) (--) [address], [address] [place] [address] [phone]   Mar 05, 2024 2:00 PM Follow Up Appointment with Rosalynn Pottier, MD [facility]) [address] [place] [address] [phone]       Recommendations to physicians: none  Justification for two or more antipsychotic medications Is not being discharged on multiple antipsychotic meds  Time spent in discharge process:  60 minutes    Electronically signed: Redell DELENA Elder, MD 02/01/2024 / 9:07 AM       [1] Social History Socioeconomic History  . Marital status: Married  . Number of children: 2  Occupational History    Employer: [employer]  Tobacco Use  . Smoking status: Never    Passive exposure: Never  . Smokeless tobacco: Never  Vaping Use  .  Vaping status: Never Used  Substance and Sexual Activity  . Alcohol use: No  . Drug use: No  . Sexual activity: Yes    Partners: Male

## 2024-02-07 NOTE — ED Provider Notes (Signed)
 EMERGENCY DEPARTMENT EVALUATION [facility] [place], [place]) Jennifer Cline; FMW:[id number];  Encounter Date: 02/07/2024  Patient chief complaint per triage team:  Chief Complaint  Patient presents with  . Chest Pain    Patient states that she has been given clonazepam, to try to wean her off of alprazolam.  She states that she is feeling anxious, having chest pain and panicky.  She states she just doesn't feel right.    Patient arrives via: personal vehicle to the waiting room and traige  Patient states: I dont feel right; history provided by the patient themselves;  Patient denies homicidalityAnd suicidality. States she was recently admitted to psychiatric facility for same for withdrawal. I just cannot do it on the stuff because it is just making everything worse and I cannot tolerate it and I cannot do this   ADDITIONAL INFORMATION:  none at this time, limited to the above information History of present illness:  History provided by:  Patient History limited by:  Acuity of condition Chest Pain Pain location:  Unable to specify At its most intense, the pain is at unable to specify.  Pain severity:  Unable to specify Onset quality:  Unable to specify Duration: i dont know the last few and just getting worse. Timing:  Unable to specify Progression:  Worsening Associated symptoms: anorexia, anxiety, dizziness, fatigue, nausea, palpitations and shortness of breath   Associated symptoms: no cough   Risk factors comment:  I was just in the hospital the other day    REVIEW OF SYSTEMS Review of Systems  Unable to perform ROS: Acuity of condition  Constitutional:  Positive for activity change, appetite change, fatigue and unexpected weight change.  Respiratory:  Positive for shortness of breath. Negative for cough.   Cardiovascular:  Positive for chest pain and palpitations.  Gastrointestinal:  Positive for anorexia and nausea.  Neurological:   Positive for dizziness.  Psychiatric/Behavioral:  Positive for decreased concentration and sleep disturbance. Negative for dysphoric mood and suicidal ideas. The patient is nervous/anxious and is hyperactive.    PREVIOUS MEDICAL HISTORY: I reviewed and confirmed the patient's previous medical, surgical, social, family, surgical and substance abuse as previously entered in EMR and nursing/triage staff Past Medical History:  Diagnosis Date  . Allergy   . Allergy history unknown   . Anemia   . Anxiety   . Cancer (*)    basal cell carcinoma  . Chronic sinusitis   . Depression   . Diverticulitis   . GERD (gastroesophageal reflux disease)   . Hyperlipidemia    Past Surgical History:  Procedure Laterality Date  . Appendectomy    . Cholecystectomy  11/27/2023  . Colonoscopy    . Tubal ligation    . Upper gastrointestinal endoscopy  2015    Family History  Problem Relation Age of Onset  . Cancer Father 13       brain  . Hypothyroidism Mother   . Heart disease Mother        chf  . Cancer Maternal Aunt        breast  . Hypothyroidism Sister   . Breast cancer Sister   . Hypothyroidism Daughter   . Migraines Daughter   . Hypothyroidism Maternal Grandmother   . No Known Problems Brother   . Breast cancer Sister   . No Known Problems Sister   . No Known Problems Sister   . No Known Problems Brother   . Hypothyroidism Daughter   . Diabetes Neg Hx   .  Colon cancer Neg Hx   . Colon polyps Neg Hx    Social History[1]     PHYSICAL EXAMINATION OF THE PATIENT - Vitals: Ht 1.575 m (5' 2)   Wt 56.7 kg (125 lb)   BMI 22.86 kg/m ; Body mass index is 22.86 kg/m.; Ideal body weight: 50.1 kg (110 lb 7.2 oz) Adjusted ideal body weight: 52.7 kg (116 lb 4.3 oz) (based on vitals obtained from nursing) Physical Exam  Nursing note and vitals reviewed. Constitutional: She appears well-developed and well-nourished. She does not appear distressed and no respiratory distress (normal rate  noted;). She is cooperative. She is easily aroused. She does not have a sickly appearance. She does not appear chronically ill.  HENT:  Head: Normocephalic and atraumatic.  Mouth/Throat: Voice normal.  Eyes: EOM are intact. Conjunctivae and lids are normal. Pupils are equal, round, and reactive to light.  Neck: Trachea normal, full passive range of motion without pain and voice normal.  Head turns to follow me as I walk about the room from side to side for the exam;   Cardiovascular: Regular rhythm, S1 normal, S2 normal, intact distal pulses and normal pulses. Occasional extrasystoles are present. Tachycardia present. PMI is not displaced.  Audible murmur.  Systolic murmur is present with a grade of 2/6. Pulses:      Radial pulses are 2+ on the right side and 2+ on the left side.       Dorsalis pedis pulses are 2+ on the right side and 2+ on the left side. Exam reveals no decreased pulses.  Pulmonary/Chest: No respiratory distress (normal rate noted;). No accessory muscle use. Respiratory effort normal and breath sounds normal. No decreased breath sounds.  Abdominal: Soft. Bowel sounds are normal. Normal appearance.  Musculoskeletal:     Cervical back: Full passive range of motion without pain.     Comments: Full range of motion of the upper and lower extremities with no gross deficits noted   Neurological: She is alert, oriented to person, place, and time and easily aroused. She is not disoriented.  Skin: Skin is warm. Skin is intact. Skin is dry.  Psychiatric: She is inattentive. Her speech is normal. Speech is rapid and/or pressured. Speech is tangential. She has poor eye contact. She is hyperactive. She appears anxious. She is not actively hallucinating. She expresses impulsivity.   WORKING DIFFERENTIAL DIAGNOSIS: I consider the following to be a partial list of etiologies for the s/s: - HTN: Acute Kidney Injury (AKI), Aortic Coarctation, Aortic Dissection, Chronic Kidney Disease (CKD),  Eclampsia, Hypercalcemia, Hyperthyroidism, Thyrotoxicosis, Pheochromocytoma, Renal artery Stenosis, Subarachnoid hemorrhage, thrombotic thrombocytopenic, purpura (TTP), drug induced, chronic renal failure, renal artery stenosis, aortic coarctation, sleep apneas, hypoaldosteronism, hyperthyroidism, hyperparathyroidism, cushing syndrome, pheochromocytoma, acromegaly, collagen vascular disease, and others... - Chest Pain: ACS, myocardial infarction, pericarditis, mediastinum infection, and others... - Palpitations: Arrhythmias, atrial fibrillation/flutter, bradycardia from advanced AV block or sinus node dysfunction, Tachy-brady syndrome, multifocal atrial tachycardia, VTach, SVT, sinus tachy, WPW, anxiety/panic disorder, ETOH, caffeine, medication (both prescription and OTC), street drugs, tobacco, ASD.VSD, cardiomyopathy, congenital heart dz, pacemaker,, anemia, electrolytes, hyper/hypovolemia, dysthymia, and others... - Mental Status Changes: SDH, SAH, brain CA, infection, sepsis, UTI, pneumonia, hypoxia, hypoglycemia, medication, overdose, meningitis, TIA, CVA, UTI, hyperammonemia, and others... - Syncope: dehydration, drug-induced orthostasis, dysautonomia, hemorrhage, hypotension, postural hypotension, subclavian steal, vasodepressor/vasovagal response, vasomotor insufficiency, dysrhythmias, outflow obstruction, prolonged QT, physical such as posttussive/defecation/micturition/postprandial syncope, hyperventilation, hydrocephalus, migraine, narcolepsy, panic attacks, seizure disorder, and others... - Intoxication: alcohol, heavy metal ingestion, hyperammonemia, SDH,  SAH, metabolic lab abnormality, infection, substance abuse with opiods, benzodiazapines, and others... - Psychiatric: infection, organic cause, medication reaction, dysthymia, suicidal or homicidal ideation, sepsis, malingering, drug seeking behavior, and others MEDICAL DECISION MAKING: MDM Number of Diagnoses or Management Options Anxiety  attack: new and requires workup Benzodiazepine withdrawal with complication (*): new and requires workup History of benzodiazepine use: new and requires workup Tachycardia: new and requires workup   Amount and/or Complexity of Data Reviewed Clinical lab tests: reviewed and ordered Tests in the radiology section of CPT: reviewed Review and summarize past medical records: yes Independent visualization of images, tracings, or specimens: yes  Risk of Complications, Morbidity, and/or Mortality Presenting problems: high Diagnostic procedures: high Management options: high  Patient Progress Patient progress: improved     Procedures - None needed at this time  PROGRESS NOTES  05:04: I completed the initial evaluation of the pt,  - I spoke with both the pt and the staff RN as to the care plan and expectations,  - verified my name on the patient's board in the room, - ordered initial labs and meds for the pt in the EPIC system;  ===> I deem that this patient is currently STABLE  and DOES NOT need immediate intervention.  06:22: Patient asking for additional benzodiazepines, and told her I am not able to do that at this time she received 1 dose of 5 mg of Valium which will fill her benzo receptors at this time I will be happy to use a nonbenzo medication, Zyprexa, to help with her anxiety.   : Laboratory studies reviewed and noted Notable Abnormals Notable Normal  Actions  none Trops, UDS NO ACUTE ACTIONS  were needed at this time, will re-evaluate the patient at bedside and re-access them,      06:33: Rechecked the patient at the bedside. - Updated vitals: BP 105/60   Pulse 70   Temp 97.7 F (36.5 C) (Oral)   Resp 18   Ht 1.575 m (5' 2)   Wt 56.7 kg (125 lb)   SpO2 96%   BMI 22.86 kg/m ; these are improved from the previous vitals as listed above - Limited recheck: Patient states feeling better,But demanding that she have the benzodiazepines that she has had before - Actions  taken: none  06:33: I do want to keep taking the stuff that they gave me at the hospital is just not working anymore and I just do not to take it. - Patient is again refusing homicidality or suicidality at this time - States she is open to continuing the medication that she was prescribed at her recent behavioral health visit as long as she will also take the Zyprexa low-dose that I am offering her as well.  Disposition decision - DISCHARGE  06:34 : I have performed a Medical Screening Examination (MSE) and determined that the patient is stable, has no acute medical condition requiring further immediate intervention and it is safe to discharge the patient. - I discussed this with the patient and ,explained the DDx, workup and results completed in the ED, expected course of the disease, and follow up instructions including several reasons to return to the Emergency Department sooner if needed including worsening/return of their initial symptoms, vomiting and cannot keep the new medications down, developing a fever that does not respond to the medications, or overall worsening of their general health - the pt will be discharged to home, with the current living arrangements , with family, with a sober ride (  someone else to drive), and via private car  Medications:  Existing meds were reviewed and I will not be be making any changes at this time; please take them as directed New prescriptions: there will be be new medications issued upon discharge; they will be sent electronically to the pharmacy that they have requested, Narcotics/Sedative medications for a short term (<48hrs) ARE indicated as part of this patient's medical management in the short term;  and PDMP has been reviewed outside of the EPIC system in compliance with prescribing guidelines  EMERGENCY DEPARTMENT WORKING DIFFERENTIAL DIAGNOSIS:  Diagnoses that have been ruled out:  Acute benzo withdrawal  Diagnoses that are still under  consideration:  Drug-seeking behavior Anxiety about health  Final diagnoses:  Anxiety attack  Benzodiazepine withdrawal with complication (*)  Tachycardia  History of benzodiazepine use  History of major depression  Noncompliance with medications    _____________________________________________________________________ Norleen Arthur DO FACOEP-Dist, FACEP, FAWM (This chart was electronically created using dictation services, there may be spelling errors and other transcription issues as well given this modality.  Which are created in error.  If there is any acute need for resolution or changes that need to be amended, please notify the medical records department)       [1] Social History Socioeconomic History  . Marital status: Married  . Number of children: 2  Occupational History    Employer: [employer]  Tobacco Use  . Smoking status: Never    Passive exposure: Never  . Smokeless tobacco: Never  Vaping Use  . Vaping status: Never Used  Substance and Sexual Activity  . Alcohol use: No  . Drug use: No  . Sexual activity: Yes    Partners: Male   Norleen JULIANNA Arthur, [place] 02/19/24 1743

## 2024-02-11 ENCOUNTER — Ambulatory Visit: Payer: Self-pay | Admitting: Internal Medicine

## 2024-02-11 NOTE — Progress Notes (Signed)
 History of Present Illness The patient is a 68 year old female who presents to follow up on her agoraphobia with panic disorder, major depressive disorder, and generalized anxiety.  She reports experiencing heightened anxiety, which she believes is exacerbated by her husband's health issues over the past 4 years. She feels a strong need to care for him, which has led to feelings of depression. She has tried various antidepressants, but they have all caused adverse reactions. Previously, Dr. Patrcia at [facility] prescribed alprazolam 0.25 mg three times daily, which was effective in managing her symptoms. This treatment allowed her to regain weight, resume grocery shopping, and drive.   Following gallbladder surgery on 11/27/2023, she experienced a period of illness lasting 3 to 4 weeks, during which she lost significant weight due to an inability to eat. This period also saw the return of her panic and anxiety attacks. She sought help from [facility], where she was prescribed alprazolam 1 mg. Upon returning to her primary care provider, [facility], her prescription was refilled with instructions not to exceed 0.5 mg three times daily. She reports possibly exceeding this dosage during her recovery from gallbladder surgery. Subsequently, [facility] initiated Paxil therapy, which led to a worsening of her symptoms, culminating in a hospital admission due to a panic and anxiety attack. During this admission, she was treated with Ativan  and underwent a detoxification process, which made her feel unwell. She was discharged after 3 days with instructions to taper off her medications. Currently, she reports feeling jittery and nervous, which she attributes to withdrawal symptoms. She is requesting a prescription for alprazolam 0.25 mg three times daily, a medication she has used effectively in the past.  Marital Status: Married Alcohol: She reports no alcohol consumption. Recreational Drugs: She reports no history of  drug use.  PAST SURGICAL HISTORY: Gallbladder surgery on 11/27/2023  Vitals:   02/11/24 1417  BP: 132/79  Pulse: 80  Resp: 16  Temp: 98 F (36.7 C)  SpO2: 97%   Gen: Alert, oriented, non toxic, and well hydrated.  No signs of acute distress. HEENT: Normocephalic.  Atraumatic.  PERRLA.   Neck: Supple. No lymphadenopathy Respiratory:  No use of accessory muscles. Cardiovascular: Regular rate and rhythm.   Abdominal:  Soft, non tender, non distended.    Neuro: Cranial nerves intact grossly.  No loss of strength, sensation MS:  Full range of motion.  No cyanosis, clubbing, or edema. Skin:  No rashes noted Psych: Oriented, alert.  Assessment & Plan  1. GAD (generalized anxiety disorder)      2. Panic disorder with agoraphobia        MDM:   1. Severe anxiety with panic attacks and agoraphobia. - Symptoms include difficulty going to the grocery store and driving due to anxiety and panic attacks. - Previous treatments included various antidepressants and Paxil, which worsened symptoms, leading to hospitalization. - Discussed the potential dangers of benzodiazepine use, including dementia and respiratory depression. - Alprazolam 0.25 mg prescribed to be taken three times a day; medication sent to pharmacy.  Patient  verbalized to me that they understood what their problem is, what they need to do about it, and why it is important that they do it.  The patient/family voices understanding of all medications. No barriers to adherence were noted. Patient is taking all medications as prescribed and is tolerating well.  Plan for follow-up as discussed or as needed if any worsening symptoms or change in condition.

## 2024-02-24 NOTE — Progress Notes (Addendum)
 Subjective:   Jennifer Cline is a 68 y.o. female who presents for follow up of anxiety.  *Addendum 03/04/2024* Patient clarifies that she has not taken all of the xanax prescribed on 02/11/2024 and desires to be off this medication. Alcohol: She reports no alcohol consumption. Recreational Drugs: She reports no history of drug use.  She presented to [facility] on 01/29/24 for detox off of alprazolam and was kept in [facility] for 3 days. Per their note she had been taking it for 2 months and could not stop on her own and felt the urge to continually escalate dosing. Discharged on Klonopin taper and gabapentin. She finished taper and immediately called here for a refill on her xanax. This was sent in at a low dose and in 13 days she has overused her xanax and has doubled the dose and used #90 pills in 2 weeks. (See Telephone message 02/16/24). She called here to get her Rx increased from the 0.25mg  tabs to the 0.5mg  tabs TID because that is how she had been taking her previous rx. She was advised she needed to come in. She denied making that call and said she did not request  a dose increase. I have reviewed her Behavioral Health notes and will resume their klonazepam taper as instructed by dr. Malinda at discharge and she is advised this office will no longer be prescribing benzos to her as she is a risk for overdose due to overuse of current prescription. She did not keep her psychiatry appointment as scheduled. SHe is given the name and number to call psychiatry from previous referral I put in and she never went. Patient is in agreement with our plan today to do the klonopin taper and may take the gabapentin that Dr. Malinda prescribed her at discharge. She is advised she needs a higher level of care as her anxiety would be best treated with psychiatry. Given the names of [facility] and [facility] ED for acute treatment if needed. spoke with pharmacist at [facility] and  discontinued any additional refills on xanax from our office.  She has lost a significant amount of weight since having gallbladder surgery. GI is following this closely  Helaine Dupont, RN (NP student) was in the room for the entirety of this visit.  Review of Systems - All other systems were reviewed and are negative unless stated in HPI.  Family History  Problem Relation Age of Onset  . Cancer Father 87       brain  . Hypothyroidism Mother   . Heart disease Mother        chf  . Cancer Maternal Aunt        breast  . Hypothyroidism Sister   . Breast cancer Sister   . Hypothyroidism Daughter   . Migraines Daughter   . Hypothyroidism Maternal Grandmother   . No Known Problems Brother   . Breast cancer Sister   . No Known Problems Sister   . No Known Problems Sister   . No Known Problems Brother   . Hypothyroidism Daughter   . Diabetes Neg Hx   . Colon cancer Neg Hx   . Colon polyps Neg Hx    Past Medical History:  Diagnosis Date  . Allergy   . Allergy history unknown   . Anemia   . Anxiety   . Cancer (*)    basal cell carcinoma  . Chronic sinusitis   . Depression   . Diverticulitis   .  GERD (gastroesophageal reflux disease)   . Hyperlipidemia    Past Surgical History:  Procedure Laterality Date  . Appendectomy    . Cholecystectomy  11/27/2023  . Colonoscopy    . Tubal ligation    . Upper gastrointestinal endoscopy  2015   Pediatric History  Patient Parents  . Not on file   Other Topics Concern  . Not on file  Social History Narrative  . Not on file    Objective:   BP 125/71 (BP Location: Left Upper Arm, Patient Position: Sitting)   Pulse 83   Temp 98.3 F (36.8 C) (Temporal)   Resp 16   Ht 5' 2 (1.575 m)   Wt 115 lb 3.2 oz (52.3 kg)   SpO2 99%   Breastfeeding No   BMI 21.07 kg/m  Gen: Alert, oriented, non toxic, and well hydrated.  No signs of acute distress or pain.SABRA HEENT: Normocephalic.  Atraumatic.  Sclera anicteric. Neck:  Supple Respiratory:  Lungs clear to auscultation.  No use of accessory muscles. Cardiovascular: Regular rate and rhythm.  No murmurs noted Abdominal:  Soft, non tender, non distended.  No hepatosplenomegally Neuro: Cranial nerves intact grossly.  No loss of strength, sensation Extremities:  Full range of motion.  No cyanosis or clubbing. Skin:  No rashes noted Psych: Oriented, alert.  Mood and affect are normal.  Answers questions appropriately.  No sadness or depressive sx noted.  Assessment:   1. GAD (generalized anxiety disorder)  DISCONTINUED: clonazepam (KLONOPIN) 0.25 MG disintegrating tablet    2. Visit for screening mammogram  Mammo 3D Tomo Screening Bilateral    3. Medication management  ToxASSURE Select 13    4. Anxiety      5. Moderate protein-calorie malnutrition (*)        Plan:   Orders Placed This Encounter  Procedures  . Mammo 3D Tomo Screening Bilateral  . ToxASSURE Select 13   - spoke with pharmacist at [facility] and discontinued any additional refills on xanax from our office. Reordered the klonopin taper as prescribed by Dr. Malinda in Behavioral health for taper off benzos. She is advised that this office will no longer prescribe benzos for her as she needs a higher level of care than we can offer and gave her the number to psychiatry to call to schedule an appt. SHe states she had an appt and did not go but is willing to go now. - advised of [facility] and [facility] should she need a higher level of care. She is aware of both of these resources. -COntinue with GI to address dietary, digestion, weight loss issues. - Continue to take medications as prescribed.   - Return to clinic to be reevaluated if symptoms worsen, persist, change, or if you have any other concerns. - I discussed this diagnosis with the patient and discussed the treatment plan with them.  This treatment plan is also outlined in the Patient Instructions and a copy of this was  provided to the patient.  - Patient  verbalized to me that they understood what their problem is, what they need to do about it, and why it is important that they do it.  The patient/family voices understanding of all medications. No barriers to adherence were noted. Patient is taking all medications as prescribed and is tolerating well.  Plan for follow-up as discussed or as needed if any worsening symptoms or change in condition.

## 2024-03-02 ENCOUNTER — Ambulatory Visit (HOSPITAL_COMMUNITY)
Admission: EM | Admit: 2024-03-02 | Discharge: 2024-03-02 | Disposition: A | Discharge status: Home / Self Care | Payer: Medicare (Managed Care)

## 2024-03-02 DIAGNOSIS — F411 Generalized anxiety disorder: Secondary | ICD-10-CM

## 2024-03-02 DIAGNOSIS — F1320 Sedative, hypnotic or anxiolytic dependence, uncomplicated: Secondary | ICD-10-CM

## 2024-03-02 DIAGNOSIS — F4001 Agoraphobia with panic disorder: Secondary | ICD-10-CM

## 2024-03-02 NOTE — Progress Notes (Signed)
   03/02/24 1001  [facility] Triage Screening (Walk-ins at [facility] only)  What Is the Reason for Your Visit/Call Today? PT Jennifer Cline [phone] female presents to [facility] unaccompanied voluntarily. PT states she is diagnosed with GAD and recently her anxiety has been really severe to where she is having panic anxiety attacks. Pt states staff at [facility] were really rude to her, accusing her of taking over half her anxiety medication pills and requesting more (PT denies taking over half); [facility] were not going to request her refill. PT states [facility] blew the situation out of proportion. PT states she does not do well with antidepressant medications, she has a poor appetite. PT mentioned she would like to try Zoloft. PT denied SI, HI, AVH and alcohol / substance use.  How Long Has This Been Causing You Problems? <Week  Are You Planning to Commit Suicide/Harm Yourself At This time? No  Have you Recently Had Thoughts About Hurting Someone Sherral? No  Are You Planning To Harm Someone At This Time? No  Physical Abuse Denies  Verbal Abuse Denies  Sexual Abuse Denies  Exploitation of patient/patient's resources Denies  Self-Neglect Denies  Have You Used Any Alcohol or Drugs in the Past 24 Hours? No  Do you have any current medical co-morbidities that require immediate attention? No  Clinician description of patient physical appearance/behavior: neat, cooperative, talkative  What Do You Feel Would Help You the Most Today? Medication(s)  Determination of Need Routine (7 days)  Options For Referral Medication Management

## 2024-03-02 NOTE — ED Provider Notes (Signed)
 Behavioral Health Urgent Care Medical Screening Exam  Patient Name: Jennifer Cline MRN: [id number] Date of Evaluation: 03/02/24 Chief Complaint:   They just stopped Lorazepam  Diagnosis:  Final diagnoses:  GAD (generalized anxiety disorder)  Agoraphobia with panic attacks  Benzodiazepine dependence (HCC)   History of Present illness: Jennifer Cline is a 68 y.o. female, history of generalized anxiety disorder, agoraphobia with panic attacks, benzodiazepine dependence presents today seeking medication management.  Patient is currently followed by [facility] Provider Kip Corrington, MD for primary care and provider is also managing patient's psychiatric medication.  Patient has been prescribed over the last few month Xanax 0.5 mg 3 times daily for treatment of anxiety.  Patient reports that she phoned her primary care office regarding a refill and informed them she had been taking an extra dose to manage anxiety symptoms and they advised her that she could no longer get any further prescriptions for Xanax and prescribed her clonidine taper to taper her off of the benzos.  She reports that she still has close to 40 tablets left but opted to take the clonazepam taper as she was told she could not get any further prescriptions.  She is wanting to establish with a psychiatric medication prescriber to either continue the Xanax or try Zoloft .  She further reports that she was prescribed gabapentin for her anxiety but took only a couple doses as it caused GI upset but reports she was not taking the medication with food.  She is really upset as she felt that the prescriber was accusing her of misusing the benzo prescription. Patient denies any suicidal or homicidal ideations.  Patient is routine today and seeking outpatient management.  Patient has a appointment with a mental health prescriber late October however feels she is unable to wait that long to to be placed on psychiatric medications and is open  to a referral.  During evaluation Jennifer Cline is sitting upright in no acute distress. She is alert, oriented x 4, appears minimally anxious, without panic, cooperative and attentive.Her mood is anxious and euthymic with congruent affect.  She has normal speech, and behavior.  Objectively there is no evidence of psychosis/mania or delusional thinking.  Patient is able to converse coherently, goal directed thoughts, no distractibility, or pre-occupation. She also denies suicidal/self-harm/homicidal ideation, psychosis, and paranoia.  Patient answered question appropriately.     Flowsheet Row ED from 03/02/2024 in [facility]  C-SSRS RISK CATEGORY No Risk    Psychiatric Specialty Exam  Presentation  General Appearance:Casual  Eye Contact:Good  Speech:Clear and Coherent  Speech Volume:Normal  Handedness:No data recorded  Mood and Affect  Mood: Depressed; Anxious  Affect: Congruent   Thought Process  Thought Processes: Coherent; Goal Directed; Linear  Descriptions of Associations:Intact  Orientation:Full (Time, Place and Person)  Thought Content:WDL    Hallucinations:None  Ideas of Reference:No data recorded Suicidal Thoughts:No  Homicidal Thoughts:No   Sensorium  Memory: Immediate Good; Recent Good; Remote Good  Judgment: Fair  Insight: Fair   Chartered certified accountant: Fair  Attention Span: Fair  Recall: [employer] of Knowledge: Fair  Language: Fair   Psychomotor Activity  Psychomotor Activity: Normal   Assets  Assets:Resilience, Health    Sleep  Sleep: Poor  Number of hours: 8 hours   Physical Exam: Physical Exam Constitutional:      General: She is not in acute distress.    Appearance: Normal appearance. She is not ill-appearing.  HENT:  Head: Normocephalic and atraumatic.     Nose: Nose normal.  Eyes:     Extraocular Movements: Extraocular movements intact.      Conjunctiva/sclera: Conjunctivae normal.     Pupils: Pupils are equal, round, and reactive to light.  Cardiovascular:     Rate and Rhythm: Normal rate and regular rhythm.  Pulmonary:     Effort: Pulmonary effort is normal.     Breath sounds: Normal breath sounds.  Musculoskeletal:     Cervical back: Normal range of motion and neck supple.  Skin:    General: Skin is warm and dry.  Neurological:     General: No focal deficit present.     Mental Status: She is alert and oriented to person, place, and time.    Review of Systems  Psychiatric/Behavioral:  Positive for substance abuse. Negative for memory loss. The patient is nervous/anxious. The patient does not have insomnia.     Blood pressure 130/74, pulse 85, temperature 98.3 F (36.8 C), temperature source Temporal, resp. rate 16, SpO2 100%. There is no height or weight on file to calculate BMI.  Musculoskeletal: Strength & Muscle Tone: within normal limits Gait & Station: normal Patient leans: N/A   [facility] MSE Discharge Disposition for Follow up and Recommendations: Based on my evaluation the patient does not appear to have an emergency medical condition and can be discharged with resources and follow up care in outpatient services for Medication Management  -Referral placed for [facility] and hold placed with a psychiatric prescriber new patient appointment for 03/03/2024 - Patient advised that a link to complete registration was sent to her email and that this would need to be completed in its entirety in order to secure her appointment for 03/03/2024 at 1 PM with a psychiatric medication prescriber.  Patient verbalized understanding and agreement with plan advised her to continue and complete the remaining doses of the clonazepam taper.  Advised patient not to take any remaining doses of Xanax with the clonazepam.  Advised her to avoid taking the Xanax only in the event of severe panic or anxiety attack but agreed with prescriber that  she really needs to discontinue taking benzodiazepines as they can be highly addictive and there are alternative treatments to manage her anxiety.  Encouraged food intake with Gabapentin  to avoid GI upset as this medication can be effective in managing anxiety symptoms.  Follow-up with primary care provider as needed.  Patient verbalized understanding and agreement with plan   Suzen Lesches, NP 03/02/2024, 11:28 AM

## 2024-03-02 NOTE — Discharge Instructions (Signed)
 You were referred to [facility] and schedule to a mental health provider with prescriber privileges, at 1:00 pm tomorrow. Check your email within the next 30 minutes and also check your span folder you should receive an email from [facility] requesting that she complete the registration form in order to confirm your appointment for tomorrow at 1 PM.  He must have access to a digital vice to be able to complete the appointment.  As discussed complete your clonazepam taper as prescribed.  Only use the remaining doses of her Lorazepam  only in the event of a severe panic attack and avoid taking the lorazepam  daily as this can cause dependence.
